# Patient Record
Sex: Female | Born: 1970
Health system: Southern US, Community
[De-identification: ages and names within clinical notes are randomized; demographics above are authoritative.]

## PROBLEM LIST (undated history)

## (undated) DIAGNOSIS — G43909 Migraine, unspecified, not intractable, without status migrainosus: Secondary | ICD-10-CM

## (undated) DIAGNOSIS — R011 Cardiac murmur, unspecified: Secondary | ICD-10-CM

## (undated) DIAGNOSIS — T4145XA Adverse effect of unspecified anesthetic, initial encounter: Secondary | ICD-10-CM

## (undated) DIAGNOSIS — F419 Anxiety disorder, unspecified: Secondary | ICD-10-CM

## (undated) DIAGNOSIS — D352 Benign neoplasm of pituitary gland: Secondary | ICD-10-CM

## (undated) DIAGNOSIS — Z87898 Personal history of other specified conditions: Secondary | ICD-10-CM

## (undated) DIAGNOSIS — E348 Other specified endocrine disorders: Secondary | ICD-10-CM

## (undated) DIAGNOSIS — T8859XA Other complications of anesthesia, initial encounter: Secondary | ICD-10-CM

## (undated) DIAGNOSIS — K760 Fatty (change of) liver, not elsewhere classified: Secondary | ICD-10-CM

## (undated) DIAGNOSIS — R7303 Prediabetes: Secondary | ICD-10-CM

## (undated) DIAGNOSIS — N809 Endometriosis, unspecified: Secondary | ICD-10-CM

## (undated) DIAGNOSIS — K219 Gastro-esophageal reflux disease without esophagitis: Secondary | ICD-10-CM

## (undated) DIAGNOSIS — J45909 Unspecified asthma, uncomplicated: Secondary | ICD-10-CM

## (undated) DIAGNOSIS — R55 Syncope and collapse: Secondary | ICD-10-CM

## (undated) DIAGNOSIS — R748 Abnormal levels of other serum enzymes: Secondary | ICD-10-CM

## (undated) DIAGNOSIS — G2581 Restless legs syndrome: Secondary | ICD-10-CM

## (undated) DIAGNOSIS — M797 Fibromyalgia: Secondary | ICD-10-CM

## (undated) DIAGNOSIS — E785 Hyperlipidemia, unspecified: Secondary | ICD-10-CM

## (undated) DIAGNOSIS — T7491XA Unspecified adult maltreatment, confirmed, initial encounter: Secondary | ICD-10-CM

## (undated) HISTORY — DX: Syncope and collapse: R55

## (undated) HISTORY — DX: Hyperlipidemia, unspecified: E78.5

## (undated) HISTORY — PX: UPPER GI ENDOSCOPY: SHX6162

## (undated) HISTORY — PX: DIAGNOSTIC LAPAROSCOPY: SUR761

## (undated) HISTORY — PX: APPENDECTOMY: SHX54

## (undated) HISTORY — PX: ULNAR NERVE REPAIR: SHX2594

## (undated) HISTORY — DX: Fibromyalgia: M79.7

## (undated) HISTORY — PX: FUNCTIONAL ENDOSCOPIC SINUS SURGERY: SUR616

## (undated) HISTORY — PX: FLEXIBLE SIGMOIDOSCOPY: SHX1649

## (undated) HISTORY — DX: Abnormal levels of other serum enzymes: R74.8

---

## 1998-10-06 HISTORY — PX: VAGINAL HYSTERECTOMY: SUR661

## 2014-12-11 ENCOUNTER — Emergency Department (HOSPITAL_COMMUNITY)
Admission: EM | Admit: 2014-12-11 | Discharge: 2014-12-11 | Disposition: A | Discharge status: Home / Self Care | Payer: 59 | Attending: Emergency Medicine | Admitting: Emergency Medicine

## 2014-12-11 ENCOUNTER — Emergency Department (HOSPITAL_COMMUNITY): Payer: 59

## 2014-12-11 ENCOUNTER — Encounter (HOSPITAL_COMMUNITY): Payer: Self-pay | Admitting: Emergency Medicine

## 2014-12-11 DIAGNOSIS — J0100 Acute maxillary sinusitis, unspecified: Secondary | ICD-10-CM | POA: Diagnosis not present

## 2014-12-11 DIAGNOSIS — H538 Other visual disturbances: Secondary | ICD-10-CM | POA: Diagnosis not present

## 2014-12-11 DIAGNOSIS — M542 Cervicalgia: Secondary | ICD-10-CM | POA: Insufficient documentation

## 2014-12-11 DIAGNOSIS — Z79899 Other long term (current) drug therapy: Secondary | ICD-10-CM | POA: Diagnosis not present

## 2014-12-11 DIAGNOSIS — Z720 Tobacco use: Secondary | ICD-10-CM | POA: Insufficient documentation

## 2014-12-11 DIAGNOSIS — Z792 Long term (current) use of antibiotics: Secondary | ICD-10-CM | POA: Insufficient documentation

## 2014-12-11 DIAGNOSIS — R42 Dizziness and giddiness: Secondary | ICD-10-CM | POA: Insufficient documentation

## 2014-12-11 LAB — URINALYSIS, ROUTINE W REFLEX MICROSCOPIC
BILIRUBIN URINE: NEGATIVE
GLUCOSE, UA: NEGATIVE mg/dL
Hgb urine dipstick: NEGATIVE
Ketones, ur: NEGATIVE mg/dL
Leukocytes, UA: NEGATIVE
NITRITE: NEGATIVE
PH: 6 (ref 5.0–8.0)
Protein, ur: NEGATIVE mg/dL
Urobilinogen, UA: 0.2 mg/dL (ref 0.0–1.0)

## 2014-12-11 LAB — CBC
HCT: 41.6 % (ref 36.0–46.0)
Hemoglobin: 13.8 g/dL (ref 12.0–15.0)
MCH: 31.2 pg (ref 26.0–34.0)
MCHC: 33.2 g/dL (ref 30.0–36.0)
MCV: 94.1 fL (ref 78.0–100.0)
Platelets: 349 10*3/uL (ref 150–400)
RBC: 4.42 MIL/uL (ref 3.87–5.11)
RDW: 13.6 % (ref 11.5–15.5)
WBC: 9 10*3/uL (ref 4.0–10.5)

## 2014-12-11 LAB — BASIC METABOLIC PANEL
ANION GAP: 6 (ref 5–15)
BUN: 13 mg/dL (ref 6–23)
CALCIUM: 9.5 mg/dL (ref 8.4–10.5)
CO2: 31 mmol/L (ref 19–32)
Chloride: 102 mmol/L (ref 96–112)
Creatinine, Ser: 0.76 mg/dL (ref 0.50–1.10)
Glucose, Bld: 93 mg/dL (ref 70–99)
Potassium: 4.1 mmol/L (ref 3.5–5.1)
SODIUM: 139 mmol/L (ref 135–145)

## 2014-12-11 LAB — HEPATIC FUNCTION PANEL
ALT: 21 U/L (ref 0–35)
AST: 20 U/L (ref 0–37)
Albumin: 4.2 g/dL (ref 3.5–5.2)
Alkaline Phosphatase: 64 U/L (ref 39–117)
BILIRUBIN INDIRECT: 0.3 mg/dL (ref 0.3–0.9)
Bilirubin, Direct: 0.1 mg/dL (ref 0.0–0.5)
Total Bilirubin: 0.4 mg/dL (ref 0.3–1.2)
Total Protein: 7.4 g/dL (ref 6.0–8.3)

## 2014-12-11 LAB — TSH: TSH: 2.239 u[IU]/mL (ref 0.350–4.500)

## 2014-12-11 MED ORDER — PROMETHAZINE HCL 25 MG PO TABS: 25.0000 mg | ORAL_TABLET | Freq: Four times a day (QID) | ORAL | Status: DC | PRN

## 2014-12-11 MED ORDER — DOXYCYCLINE HYCLATE 100 MG PO CAPS: 100.0000 mg | ORAL_CAPSULE | Freq: Two times a day (BID) | ORAL | Status: DC

## 2014-12-11 MED ORDER — PROMETHAZINE HCL 25 MG/ML IJ SOLN
12.5000 mg | Freq: Once | INTRAMUSCULAR | Status: AC
  Administered 2014-12-11: 12.5 mg via INTRAVENOUS
  Filled 2014-12-11: qty 1

## 2014-12-11 MED ORDER — SODIUM CHLORIDE 0.9 % IV BOLUS (SEPSIS)
500.0000 mL | Freq: Once | INTRAVENOUS | Status: AC
  Administered 2014-12-11: 500 mL via INTRAVENOUS

## 2014-12-11 MED ORDER — KETOROLAC TROMETHAMINE 30 MG/ML IJ SOLN
30.0000 mg | Freq: Once | INTRAMUSCULAR | Status: AC
  Administered 2014-12-11: 30 mg via INTRAVENOUS
  Filled 2014-12-11: qty 1

## 2014-12-11 NOTE — ED Notes (Signed)
Pt. Wanting to go home, EDP aware, will talk to pt. And discharge shortly.

## 2014-12-11 NOTE — Discharge Instructions (Signed)
Dizziness °Dizziness is a common problem. It is a feeling of unsteadiness or light-headedness. You may feel like you are about to faint. Dizziness can lead to injury if you stumble or fall. A person of any age group can suffer from dizziness, but dizziness is more common in older adults. °CAUSES  °Dizziness can be caused by many different things, including: °· Middle ear problems. °· Standing for too long. °· Infections. °· An allergic reaction. °· Aging. °· An emotional response to something, such as the sight of blood. °· Side effects of medicines. °· Tiredness. °· Problems with circulation or blood pressure. °· Excessive use of alcohol or medicines, or illegal drug use. °· Breathing too fast (hyperventilation). °· An irregular heart rhythm (arrhythmia). °· A low red blood cell count (anemia). °· Pregnancy. °· Vomiting, diarrhea, fever, or other illnesses that cause body fluid loss (dehydration). °· Diseases or conditions such as Parkinson's disease, high blood pressure (hypertension), diabetes, and thyroid problems. °· Exposure to extreme heat. °DIAGNOSIS  °Your health care provider will ask about your symptoms, perform a physical exam, and perform an electrocardiogram (ECG) to record the electrical activity of your heart. Your health care provider may also perform other heart or blood tests to determine the cause of your dizziness. These may include: °· Transthoracic echocardiogram (TTE). During echocardiography, sound waves are used to evaluate how blood flows through your heart. °· Transesophageal echocardiogram (TEE). °· Cardiac monitoring. This allows your health care provider to monitor your heart rate and rhythm in real time. °· Holter monitor. This is a portable device that records your heartbeat and can help diagnose heart arrhythmias. It allows your health care provider to track your heart activity for several days if needed. °· Stress tests by exercise or by giving medicine that makes the heart beat  faster. °TREATMENT  °Treatment of dizziness depends on the cause of your symptoms and can vary greatly. °HOME CARE INSTRUCTIONS  °· Drink enough fluids to keep your urine clear or pale yellow. This is especially important in very hot weather. In older adults, it is also important in cold weather. °· Take your medicine exactly as directed if your dizziness is caused by medicines. When taking blood pressure medicines, it is especially important to get up slowly. °· Rise slowly from chairs and steady yourself until you feel okay. °· In the morning, first sit up on the side of the bed. When you feel okay, stand slowly while holding onto something until you know your balance is fine. °· Move your legs often if you need to stand in one place for a long time. Tighten and relax your muscles in your legs while standing. °· Have someone stay with you for 1-2 days if dizziness continues to be a problem. Do this until you feel you are well enough to stay alone. Have the person call your health care provider if he or she notices changes in you that are concerning. °· Do not drive or use heavy machinery if you feel dizzy. °· Do not drink alcohol. °SEEK IMMEDIATE MEDICAL CARE IF:  °· Your dizziness or light-headedness gets worse. °· You feel nauseous or vomit. °· You have problems talking, walking, or using your arms, hands, or legs. °· You feel weak. °· You are not thinking clearly or you have trouble forming sentences. It may take a friend or family member to notice this. °· You have chest pain, abdominal pain, shortness of breath, or sweating. °· Your vision changes. °· You notice   any bleeding.  You have side effects from medicine that seems to be getting worse rather than better. MAKE SURE YOU:   Understand these instructions.  Will watch your condition.  Will get help right away if you are not doing well or get worse. Document Released: 03/18/2001 Document Revised: 09/27/2013 Document Reviewed: 04/11/2011 [facility]  Patient Information 2015 [place], [place]. This information is not intended to replace advice given to you by your health care provider. Make sure you discuss any questions you have with your health care provider.  Sinusitis Sinusitis is redness, soreness, and inflammation of the paranasal sinuses. Paranasal sinuses are air pockets within the bones of your face (beneath the eyes, the middle of the forehead, or above the eyes). In healthy paranasal sinuses, mucus is able to drain out, and air is able to circulate through them by way of your nose. However, when your paranasal sinuses are inflamed, mucus and air can become trapped. This can allow bacteria and other germs to grow and cause infection. Sinusitis can develop quickly and last only a short time (acute) or continue over a long period (chronic). Sinusitis that lasts for more than 12 weeks is considered chronic.  CAUSES  Causes of sinusitis include:  Allergies.  Structural abnormalities, such as displacement of the cartilage that separates your nostrils (deviated septum), which can decrease the air flow through your nose and sinuses and affect sinus drainage.  Functional abnormalities, such as when the small hairs (cilia) that line your sinuses and help remove mucus do not work properly or are not present. SIGNS AND SYMPTOMS  Symptoms of acute and chronic sinusitis are the same. The primary symptoms are pain and pressure around the affected sinuses. Other symptoms include:  Upper toothache.  Earache.  Headache.  Bad breath.  Decreased sense of smell and taste.  A cough, which worsens when you are lying flat.  Fatigue.  Fever.  Thick drainage from your nose, which often is green and may contain pus (purulent).  Swelling and warmth over the affected sinuses. DIAGNOSIS  Your health care provider will perform a physical exam. During the exam, your health care provider may:  Look in your nose for signs of abnormal growths in your  nostrils (nasal polyps).  Tap over the affected sinus to check for signs of infection.  View the inside of your sinuses (endoscopy) using an imaging device that has a light attached (endoscope). If your health care provider suspects that you have chronic sinusitis, one or more of the following tests may be recommended:  Allergy tests.  Nasal culture. A sample of mucus is taken from your nose, sent to a lab, and screened for bacteria.  Nasal cytology. A sample of mucus is taken from your nose and examined by your health care provider to determine if your sinusitis is related to an allergy. TREATMENT  Most cases of acute sinusitis are related to a viral infection and will resolve on their own within 10 days. Sometimes medicines are prescribed to help relieve symptoms (pain medicine, decongestants, nasal steroid sprays, or saline sprays).  However, for sinusitis related to a bacterial infection, your health care provider will prescribe antibiotic medicines. These are medicines that will help kill the bacteria causing the infection.  Rarely, sinusitis is caused by a fungal infection. In theses cases, your health care provider will prescribe antifungal medicine. For some cases of chronic sinusitis, surgery is needed. Generally, these are cases in which sinusitis recurs more than 3 times per year, despite other  treatments. HOME CARE INSTRUCTIONS   Drink plenty of water. Water helps thin the mucus so your sinuses can drain more easily.  Use a humidifier.  Inhale steam 3 to 4 times a day (for example, sit in the bathroom with the shower running).  Apply a warm, moist washcloth to your face 3 to 4 times a day, or as directed by your health care provider.  Use saline nasal sprays to help moisten and clean your sinuses.  Take medicines only as directed by your health care provider.  If you were prescribed either an antibiotic or antifungal medicine, finish it all even if you start to feel  better. SEEK IMMEDIATE MEDICAL CARE IF:  You have increasing pain or severe headaches.  You have nausea, vomiting, or drowsiness.  You have swelling around your face.  You have vision problems.  You have a stiff neck.  You have difficulty breathing. MAKE SURE YOU:   Understand these instructions.  Will watch your condition.  Will get help right away if you are not doing well or get worse. Document Released: 09/22/2005 Document Revised: 02/06/2014 Document Reviewed: 10/07/2011 [facility] Patient Information 2015 [place], [place]. This information is not intended to replace advice given to you by your health care provider. Make sure you discuss any questions you have with your health care provider.

## 2014-12-11 NOTE — ED Notes (Addendum)
Pt reports dizziness,blurred vision,generalized weakness, fatigue since last Tuesday.pt alert and oriented. Speech clear. Airway patent.pt reports last known fever on Friday.pt denies any recent fall.

## 2014-12-11 NOTE — ED Provider Notes (Signed)
CSN: [id number]     Arrival date & time 12/11/14  0258 History  This chart was scribed for [employer]. Alvino Chapel, MD by Chester Holstein, ED Scribe. This patient was seen in room APA14/APA14 and the patient's care was started at 8:47 AM.    Chief Complaint  Patient presents with  . Dizziness    Patient is a 44 y.o. female presenting with dizziness. The history is provided by the patient. No language interpreter was used.  Dizziness Associated symptoms: headaches and weakness   Associated symptoms: no nausea, no shortness of breath and no vomiting    HPI Comments: Erica Simmons is a 44 y.o. female who presents to the Emergency Department complaining of dizziness and lightheadedness with onset 4 days ago. Pt notes assocaited headaches, blurred vision, neck pain generalized body aches, weakness, fatigue, and confusion. Pt has gained 40 lbs in 7 months despite increase in activity. Pt notes fluctuating changes in temperature between 96-101, last fever on Friday. Pt denies recent sick contacts. Pt utd with flu shot. Pt A&Ox 4. Pt with h/o of abdominal hysterectomy with bilateral salpingo oophorectomy. Pt denies nausea, vomiting, difficulty urinating, dysuria, cough, LOC, and SOB. Pt has no PCP.   History reviewed. No pertinent past medical history. Past Surgical History  Procedure Laterality Date  . Abdominal hysterectomy     History reviewed. No pertinent family history. History  Substance Use Topics  . Smoking status: Current Some Day Smoker -- 0.05 packs/day  . Smokeless tobacco: Not on file  . Alcohol Use: No   OB History    No data available     Review of Systems  Constitutional: Positive for fever, fatigue and unexpected weight change.  Eyes: Positive for visual disturbance.  Respiratory: Negative for cough and shortness of breath.   Gastrointestinal: Negative for nausea and vomiting.  Genitourinary: Negative for dysuria and difficulty urinating.  Musculoskeletal: Positive for  myalgias and neck pain.  Neurological: Positive for dizziness, weakness, light-headedness and headaches. Negative for syncope.      Allergies  Review of patient's allergies indicates no known allergies.  Home Medications   Prior to Admission medications   Medication Sig Start Date End Date Taking? Authorizing Provider  ALPRAZolam Duanne Moron) 0.5 MG tablet Take 0.5 mg by mouth at bedtime as needed for sleep.   Yes Historical Provider, MD  Aspirin-Acetaminophen-Caffeine (GOODY HEADACHE PO) Take 1 packet by mouth daily as needed (pain).   Yes Historical Provider, MD  ibuprofen (ADVIL,MOTRIN) 200 MG tablet Take 800 mg by mouth every 6 (six) hours as needed for moderate pain.   Yes Historical Provider, MD  doxycycline (VIBRAMYCIN) 100 MG capsule Take 1 capsule (100 mg total) by mouth 2 (two) times daily. 12/11/14   Davonna Belling, MD  promethazine (PHENERGAN) 25 MG tablet Take 1 tablet (25 mg total) by mouth every 6 (six) hours as needed for nausea. 12/11/14   Davonna Belling, MD   BP 116/81 mmHg  Pulse 84  Temp(Src) 98.3 F (36.8 C) (Oral)  Resp 19  Ht 5\' 3"  (1.6 m)  Wt 160 lb (72.576 kg)  BMI 28.35 kg/m2  SpO2 100% Physical Exam  Constitutional: She is oriented to person, place, and time. She appears well-developed and well-nourished.  HENT:  Head: Normocephalic.  Eyes: Conjunctivae and EOM are normal. Pupils are equal, round, and reactive to light.  Neck: Normal range of motion. Neck supple.  Cardiovascular: Normal rate, regular rhythm and normal heart sounds.   Pulmonary/Chest: Effort normal and breath sounds normal.  Abdominal: Soft. There is no tenderness.  Musculoskeletal: Normal range of motion.  Neurological: She is alert and oriented to person, place, and time.  Finger nose slightly off on right side, left side normal Good grip strength bilaterally  Heel to shin normal bilaterally  Skin: Skin is warm and dry.  Psychiatric: She has a normal mood and affect. Her behavior is  normal.  Nursing note and vitals reviewed.   ED Course  Procedures (including critical care time) DIAGNOSTIC STUDIES: Oxygen Saturation is 100% on room air, normal by my interpretation.    COORDINATION OF CARE: 8:53 AM Discussed treatment plan with patient at beside, the patient agrees with the plan and has no further questions at this time.   Labs Review Labs Reviewed  URINALYSIS, ROUTINE W REFLEX MICROSCOPIC - Abnormal; Notable for the following:    Specific Gravity, Urine <1.005 (*)    All other components within normal limits  CBC  BASIC METABOLIC PANEL  HEPATIC FUNCTION PANEL  TSH    Imaging Review Dg Chest 2 View  12/11/2014   CLINICAL DATA:  Back pain, dizziness, neck pain  EXAM: CHEST  2 VIEW  COMPARISON:  None.  FINDINGS: Cardiomediastinal silhouette is unremarkable. No acute infiltrate or pleural effusion. No pulmonary edema. Mild degenerative changes thoracic spine.  IMPRESSION: No active cardiopulmonary disease.   Electronically Signed   By: Lahoma Crocker M.D.   On: 12/11/2014 09:27   Ct Head Wo Contrast  12/11/2014   CLINICAL DATA:  Headache for 6 days  EXAM: CT HEAD WITHOUT CONTRAST  TECHNIQUE: Contiguous axial images were obtained from the base of the skull through the vertex without intravenous contrast.  COMPARISON:  None.  FINDINGS: No skull fracture is noted. There is mucosal thickening with partial opacification right maxillary sinus. Mucosal thickening with partial opacification bilateral ethmoid air cells right greater than left. The mastoid air cells are unremarkable.  No intracranial hemorrhage, mass effect or midline shift. No acute infarction. No mass lesion is noted on this unenhanced scan.  IMPRESSION: No acute intracranial abnormality. Paranasal sinuses disease as described above.   Electronically Signed   By: Lahoma Crocker M.D.   On: 12/11/2014 09:31     EKG Interpretation None      MDM   Final diagnoses:  Dizziness  Acute maxillary sinusitis, recurrence  not specified   Patient with dizziness and headache. Also has had weight gain since moving here a few months ago. Head CT reassuring except for some sinusitis. Will treat that. Lab work reassuring. Feels somewhat better after treatment with Phenergan and Toradol. Will discharge home will need to follow-up with her primary care doctor. Doubt meningitis. Lab work reassuring. Will discharge home I personally performed the services described in this documentation, which was scribed in my presence. The recorded information has been reviewed and is accurate.      Davonna Belling, MD 12/11/14 [id number]

## 2014-12-11 NOTE — ED Notes (Signed)
MD at bedside. 

## 2015-11-06 ENCOUNTER — Emergency Department (HOSPITAL_COMMUNITY): Payer: 59

## 2015-11-06 ENCOUNTER — Emergency Department (HOSPITAL_COMMUNITY)
Admission: EM | Admit: 2015-11-06 | Discharge: 2015-11-06 | Disposition: A | Discharge status: Home / Self Care | Payer: 59 | Attending: Emergency Medicine | Admitting: Emergency Medicine

## 2015-11-06 ENCOUNTER — Encounter (HOSPITAL_COMMUNITY): Payer: Self-pay | Admitting: *Deleted

## 2015-11-06 DIAGNOSIS — R52 Pain, unspecified: Secondary | ICD-10-CM

## 2015-11-06 DIAGNOSIS — R5381 Other malaise: Secondary | ICD-10-CM

## 2015-11-06 DIAGNOSIS — Z792 Long term (current) use of antibiotics: Secondary | ICD-10-CM | POA: Insufficient documentation

## 2015-11-06 DIAGNOSIS — F17200 Nicotine dependence, unspecified, uncomplicated: Secondary | ICD-10-CM | POA: Diagnosis not present

## 2015-11-06 LAB — URINALYSIS, ROUTINE W REFLEX MICROSCOPIC
Bilirubin Urine: NEGATIVE
GLUCOSE, UA: NEGATIVE mg/dL
HGB URINE DIPSTICK: NEGATIVE
Ketones, ur: NEGATIVE mg/dL
LEUKOCYTES UA: NEGATIVE
Nitrite: NEGATIVE
PH: 6.5 (ref 5.0–8.0)
Protein, ur: NEGATIVE mg/dL
SPECIFIC GRAVITY, URINE: 1.013 (ref 1.005–1.030)

## 2015-11-06 LAB — COMPREHENSIVE METABOLIC PANEL
ALT: 56 U/L — AB (ref 14–54)
AST: 55 U/L — ABNORMAL HIGH (ref 15–41)
Albumin: 3.6 g/dL (ref 3.5–5.0)
Alkaline Phosphatase: 66 U/L (ref 38–126)
Anion gap: 9 (ref 5–15)
BILIRUBIN TOTAL: 0.5 mg/dL (ref 0.3–1.2)
BUN: 12 mg/dL (ref 6–20)
CHLORIDE: 102 mmol/L (ref 101–111)
CO2: 29 mmol/L (ref 22–32)
Calcium: 9.8 mg/dL (ref 8.9–10.3)
Creatinine, Ser: 0.81 mg/dL (ref 0.44–1.00)
GFR calc Af Amer: >60 mL/min (ref >60)
Glucose, Bld: 102 mg/dL — ABNORMAL HIGH (ref 65–99)
Potassium: 4.1 mmol/L (ref 3.5–5.1)
Sodium: 140 mmol/L (ref 135–145)
TOTAL PROTEIN: 6.5 g/dL (ref 6.5–8.1)

## 2015-11-06 LAB — CBC WITH DIFFERENTIAL/PLATELET
BASOS ABS: 0 10*3/uL (ref 0.0–0.1)
Basophils Relative: 0 %
EOS PCT: 5 %
Eosinophils Absolute: 0.4 10*3/uL (ref 0.0–0.7)
HEMATOCRIT: 41.3 % (ref 36.0–46.0)
HEMOGLOBIN: 13.6 g/dL (ref 12.0–15.0)
LYMPHS ABS: 3.2 10*3/uL (ref 0.7–4.0)
LYMPHS PCT: 42 %
MCH: 30.4 pg (ref 26.0–34.0)
MCHC: 32.9 g/dL (ref 30.0–36.0)
MCV: 92.2 fL (ref 78.0–100.0)
Monocytes Absolute: 0.3 10*3/uL (ref 0.1–1.0)
Monocytes Relative: 4 %
NEUTROS ABS: 3.7 10*3/uL (ref 1.7–7.7)
NEUTROS PCT: 49 %
Platelets: 335 10*3/uL (ref 150–400)
RBC: 4.48 MIL/uL (ref 3.87–5.11)
RDW: 13.5 % (ref 11.5–15.5)
WBC: 7.5 10*3/uL (ref 4.0–10.5)

## 2015-11-06 LAB — TSH: TSH: 2.081 u[IU]/mL (ref 0.350–4.500)

## 2015-11-06 MED ORDER — HYDROCODONE-ACETAMINOPHEN 5-325 MG PO TABS: 1.0000 | ORAL_TABLET | Freq: Four times a day (QID) | ORAL | Status: DC | PRN

## 2015-11-06 NOTE — ED Provider Notes (Signed)
CSN: [id number]     Arrival date & time 11/06/15  [id number] History   First MD Initiated Contact with Patient 11/06/15 [phone]     Chief Complaint  Patient presents with  . Generalized Body Aches     The history is provided by the patient. No language interpreter was used.   Erica Simmons is a 45 y.o. female who presents to the Emergency Department complaining of multiple complaints.  She reports 9 days ago developing flu like symptoms with body aches followed by ear, jaw, and throat pain.  She started amoxicillin five days ago following a dental extraction.  She also endorses generalized weakness, nausea. Symptoms are moderate, constant and worsening. She is concerned with these pains that she may have bone cancer. She has pain in all of her bones.  History reviewed. No pertinent past medical history. Past Surgical History  Procedure Laterality Date  . Abdominal hysterectomy     No family history on file. Social History  Substance Use Topics  . Smoking status: Current Some Day Smoker -- 0.50 packs/day  . Smokeless tobacco: None  . Alcohol Use: No   OB History    No data available     Review of Systems  All other systems reviewed and are negative.     Allergies  Review of patient's allergies indicates no known allergies.  Home Medications   Prior to Admission medications   Medication Sig Start Date End Date Taking? Authorizing Provider  amoxicillin (AMOXIL) 500 MG capsule Take 500 mg by mouth 3 (three) times daily.   Yes Historical Provider, MD  Aspirin-Acetaminophen-Caffeine (GOODY HEADACHE PO) Take 1 packet by mouth daily as needed (pain).   Yes Historical Provider, MD  ibuprofen (ADVIL,MOTRIN) 100 MG/5ML suspension Take 200 mg by mouth every 4 (four) hours as needed for mild pain.   Yes Historical Provider, MD  ibuprofen (ADVIL,MOTRIN) 200 MG tablet Take 800 mg by mouth every 6 (six) hours as needed for moderate pain.   Yes Historical Provider, MD  HYDROcodone-acetaminophen  (NORCO/VICODIN) 5-325 MG tablet Take 1 tablet by mouth every 6 (six) hours as needed. 11/06/15   Quintella Reichert, MD   BP 116/76 mmHg  Pulse 80  Temp(Src) 97.9 F (36.6 C) (Oral)  Resp 16  Ht 5\' 3"  (1.6 m)  Wt 155 lb (70.308 kg)  BMI 27.46 kg/m2  SpO2 100% Physical Exam  Constitutional: She is oriented to person, place, and time. She appears well-developed and well-nourished.  HENT:  Head: Normocephalic and atraumatic.  Cardiovascular: Normal rate and regular rhythm.   No murmur heard. Pulmonary/Chest: Effort normal and breath sounds normal. No respiratory distress.  Abdominal: Soft. There is no tenderness. There is no rebound and no guarding.  Musculoskeletal: She exhibits no edema or tenderness.  Neurological: She is alert and oriented to person, place, and time.  Skin: Skin is warm and dry.  Psychiatric: She has a normal mood and affect. Her behavior is normal.  Nursing note and vitals reviewed.   ED Course  Procedures (including critical care time) Labs Review Labs Reviewed  COMPREHENSIVE METABOLIC PANEL - Abnormal; Notable for the following:    Glucose, Bld 102 (*)    AST 55 (*)    ALT 56 (*)    All other components within normal limits  CBC WITH DIFFERENTIAL/PLATELET  URINALYSIS, ROUTINE W REFLEX MICROSCOPIC (NOT AT [facility])  TSH    Imaging Review Dg Chest 2 View  11/06/2015  CLINICAL DATA:  Body aches and fever for 9  days, smoker EXAM: CHEST  2 VIEW COMPARISON:  12/11/2014 FINDINGS: Normal heart size, mediastinal contours, and pulmonary vascularity. Minimal linear subsegmental atelectasis at RIGHT base. Lungs otherwise clear. No pulmonary infiltrate, pleural effusion or pneumothorax. Bones unremarkable. IMPRESSION: Linear subsegmental atelectasis at base of RIGHT middle lobe. Electronically Signed   By: Lavonia Dana M.D.   On: 11/06/2015 09:32   I have personally reviewed and evaluated these images and lab results as part of my medical decision-making.   EKG  Interpretation None      MDM   Final diagnoses:  Malaise  Body aches    Patient here for evaluation of generalized body aches and malaise. Symptoms have been present for the last several months but worsening for the last 9 days. She does endorse night sweats. She is nontoxic appearing on examination. Chest x-ray is not consistent with pneumonia. UA is negative with UTI. History of presentation is not consistent with rhabdomyolysis. There are no significant electrolyte abnormalities. Patient has minimal elevation in her transaminases of unclear significance. Discussed with patient that she will have to follow up with her PCP for further testing. Also discussed home care and return precautions.    Quintella Reichert, MD 11/06/15 [phone]

## 2015-11-06 NOTE — ED Notes (Signed)
Pt here with multiple complaints, pt has written over a one page description of S/S for the last 9 months with complaints of crying out in pain while asleep to toothache.  Pt is on Amoxicillin currently after having tooth pulled.  Pt c/o pain all over her body in her "bones".  Advises she has had "fevers" but not higher than 100.

## 2015-11-06 NOTE — ED Notes (Signed)
Pt verbalized understanding of d/c instructions, prescriptions, and follow-up care. No further questions/concerns, VSS, ambulatory w/ steady gait (refused wheelchair) 

## 2015-11-06 NOTE — Discharge Instructions (Signed)
The cause of your symptoms was not identified today. Your liver enzymes were just a little bit elevated and this will need to be rechecked by your family doctor. Avoid alcohol. Do not take high doses of Tylenol. You can take ibuprofen or Aleve over-the-counter as needed for pain.   Pain Without a Known Cause WHAT IS PAIN WITHOUT A KNOWN CAUSE? Pain can occur in any part of the body and can range from mild to severe. Sometimes no cause can be found for why you are having pain. Some types of pain that can occur without a known cause include:   Headache.  Back pain.  Abdominal pain.  Neck pain. HOW IS PAIN WITHOUT A KNOWN CAUSE DIAGNOSED?  Your health care provider will try to find the cause of your pain. This may include:  Physical exam.  Medical history.  Blood tests.  Urine tests.  X-rays. If no cause is found, your health care provider may diagnose you with pain without a known cause.  IS THERE TREATMENT FOR PAIN WITHOUT A CAUSE?  Treatment depends on the kind of pain you have. Your health care provider may prescribe medicines to help relieve your pain.  WHAT CAN I DO AT HOME FOR MY PAIN?   Take medicines only as directed by your health care provider.  Stop any activities that cause pain. During periods of severe pain, bed rest may help.  Try to reduce your stress with activities such as yoga or meditation. Talk to your health care provider for other stress-reducing activity recommendations.  Exercise regularly, if approved by your health care provider.  Eat a healthy diet that includes fruits and vegetables. This may improve pain. Talk to your health care provider if you have any questions about your diet. WHAT IF MY PAIN DOES NOT GET BETTER?  If you have a painful condition and no reason can be found for the pain or the pain gets worse, it is important to follow up with your health care provider. It may be necessary to repeat tests and look further for a possible cause.    This information is not intended to replace advice given to you by your health care provider. Make sure you discuss any questions you have with your health care provider.   Document Released: 06/17/2001 Document Revised: 10/13/2014 Document Reviewed: 02/07/2014 Elsevier Interactive Patient Education 2016 [employer]  Weakness Weakness is a lack of strength. It may be felt all over the body (generalized) or in one specific part of the body (focal). Some causes of weakness can be serious. You may need further medical evaluation, especially if you are elderly or you have a history of immunosuppression (such as chemotherapy or HIV), kidney disease, heart disease, or diabetes. CAUSES  Weakness can be caused by many different things, including:  Infection.  Physical exhaustion.  Internal bleeding or other blood loss that results in a lack of red blood cells (anemia).  Dehydration. This cause is more common in elderly people.  Side effects or electrolyte abnormalities from medicines, such as pain medicines or sedatives.  Emotional distress, anxiety, or depression.  Circulation problems, especially severe peripheral arterial disease.  Heart disease, such as rapid atrial fibrillation, bradycardia, or heart failure.  Nervous system disorders, such as Guillain-Barr syndrome, multiple sclerosis, or stroke. DIAGNOSIS  To find the cause of your weakness, your caregiver will take your history and perform a physical exam. Lab tests or X-rays may also be ordered, if needed. TREATMENT  Treatment of weakness depends  on the cause of your symptoms and can vary greatly. HOME CARE INSTRUCTIONS   Rest as needed.  Eat a well-balanced diet.  Try to get some exercise every day.  Only take over-the-counter or prescription medicines as directed by your caregiver. SEEK MEDICAL CARE IF:   Your weakness seems to be getting worse or spreads to other parts of your body.  You develop new aches or  pains. SEEK IMMEDIATE MEDICAL CARE IF:   You cannot perform your normal daily activities, such as getting dressed and feeding yourself.  You cannot walk up and down stairs, or you feel exhausted when you do so.  You have shortness of breath or chest pain.  You have difficulty moving parts of your body.  You have weakness in only one area of the body or on only one side of the body.  You have a fever.  You have trouble speaking or swallowing.  You cannot control your bladder or bowel movements.  You have black or bloody vomit or stools. MAKE SURE YOU:  Understand these instructions.  Will watch your condition.  Will get help right away if you are not doing well or get worse.   This information is not intended to replace advice given to you by your health care provider. Make sure you discuss any questions you have with your health care provider.   Document Released: 09/22/2005 Document Revised: 03/23/2012 Document Reviewed: 11/21/2011 Elsevier Interactive Patient Education [employer].

## 2015-11-10 ENCOUNTER — Encounter (HOSPITAL_COMMUNITY): Payer: Self-pay | Admitting: Emergency Medicine

## 2015-11-10 ENCOUNTER — Emergency Department (HOSPITAL_COMMUNITY): Payer: 59

## 2015-11-10 ENCOUNTER — Observation Stay (HOSPITAL_COMMUNITY)
Admission: EM | Admit: 2015-11-10 | Discharge: 2015-11-11 | Disposition: A | Discharge status: Home / Self Care | Payer: 59 | Attending: Internal Medicine | Admitting: Internal Medicine

## 2015-11-10 DIAGNOSIS — R55 Syncope and collapse: Principal | ICD-10-CM | POA: Diagnosis present

## 2015-11-10 DIAGNOSIS — M791 Myalgia: Secondary | ICD-10-CM | POA: Diagnosis not present

## 2015-11-10 DIAGNOSIS — M2550 Pain in unspecified joint: Secondary | ICD-10-CM | POA: Diagnosis not present

## 2015-11-10 DIAGNOSIS — F17210 Nicotine dependence, cigarettes, uncomplicated: Secondary | ICD-10-CM | POA: Diagnosis not present

## 2015-11-10 DIAGNOSIS — R5383 Other fatigue: Secondary | ICD-10-CM

## 2015-11-10 DIAGNOSIS — J320 Chronic maxillary sinusitis: Secondary | ICD-10-CM | POA: Diagnosis not present

## 2015-11-10 DIAGNOSIS — K047 Periapical abscess without sinus: Secondary | ICD-10-CM | POA: Diagnosis not present

## 2015-11-10 DIAGNOSIS — J322 Chronic ethmoidal sinusitis: Secondary | ICD-10-CM | POA: Insufficient documentation

## 2015-11-10 DIAGNOSIS — R748 Abnormal levels of other serum enzymes: Secondary | ICD-10-CM | POA: Diagnosis present

## 2015-11-10 DIAGNOSIS — Z90710 Acquired absence of both cervix and uterus: Secondary | ICD-10-CM | POA: Diagnosis not present

## 2015-11-10 DIAGNOSIS — W19XXXA Unspecified fall, initial encounter: Secondary | ICD-10-CM | POA: Insufficient documentation

## 2015-11-10 DIAGNOSIS — Z7982 Long term (current) use of aspirin: Secondary | ICD-10-CM | POA: Insufficient documentation

## 2015-11-10 DIAGNOSIS — Z720 Tobacco use: Secondary | ICD-10-CM | POA: Diagnosis present

## 2015-11-10 DIAGNOSIS — R945 Abnormal results of liver function studies: Secondary | ICD-10-CM

## 2015-11-10 DIAGNOSIS — M898X9 Other specified disorders of bone, unspecified site: Secondary | ICD-10-CM | POA: Diagnosis present

## 2015-11-10 LAB — CBC
HCT: 40.3 % (ref 36.0–46.0)
Hemoglobin: 13.5 g/dL (ref 12.0–15.0)
MCH: 31 pg (ref 26.0–34.0)
MCHC: 33.5 g/dL (ref 30.0–36.0)
MCV: 92.6 fL (ref 78.0–100.0)
PLATELETS: 331 10*3/uL (ref 150–400)
RBC: 4.35 MIL/uL (ref 3.87–5.11)
RDW: 13.6 % (ref 11.5–15.5)
WBC: 7.9 10*3/uL (ref 4.0–10.5)

## 2015-11-10 LAB — URINALYSIS, ROUTINE W REFLEX MICROSCOPIC
BILIRUBIN URINE: NEGATIVE
Glucose, UA: NEGATIVE mg/dL
HGB URINE DIPSTICK: NEGATIVE
Ketones, ur: NEGATIVE mg/dL
Leukocytes, UA: NEGATIVE
Nitrite: NEGATIVE
PROTEIN: NEGATIVE mg/dL
Specific Gravity, Urine: 1.019 (ref 1.005–1.030)
pH: 7 (ref 5.0–8.0)

## 2015-11-10 LAB — BASIC METABOLIC PANEL
Anion gap: 15 (ref 5–15)
BUN: 14 mg/dL (ref 6–20)
CALCIUM: 9.8 mg/dL (ref 8.9–10.3)
CO2: 26 mmol/L (ref 22–32)
CREATININE: 0.74 mg/dL (ref 0.44–1.00)
Chloride: 101 mmol/L (ref 101–111)
GFR calc non Af Amer: >60 mL/min (ref >60)
GLUCOSE: 117 mg/dL — AB (ref 65–99)
Potassium: 4.8 mmol/L (ref 3.5–5.1)
Sodium: 142 mmol/L (ref 135–145)

## 2015-11-10 LAB — CK: CK TOTAL: 128 U/L (ref 38–234)

## 2015-11-10 LAB — CBG MONITORING, ED: GLUCOSE-CAPILLARY: 113 mg/dL — AB (ref 65–99)

## 2015-11-10 LAB — HEPATIC FUNCTION PANEL
ALBUMIN: 4 g/dL (ref 3.5–5.0)
ALK PHOS: 63 U/L (ref 38–126)
ALT: 96 U/L — AB (ref 14–54)
AST: 51 U/L — ABNORMAL HIGH (ref 15–41)
BILIRUBIN TOTAL: 0.4 mg/dL (ref 0.3–1.2)
Bilirubin, Direct: <0.1 mg/dL — ABNORMAL LOW (ref 0.1–0.5)
Total Protein: 7 g/dL (ref 6.5–8.1)

## 2015-11-10 LAB — SEDIMENTATION RATE: Sed Rate: 22 mm/hr (ref 0–22)

## 2015-11-10 LAB — C-REACTIVE PROTEIN: CRP: 0.8 mg/dL (ref <1.0)

## 2015-11-10 LAB — POC URINE PREG, ED: Preg Test, Ur: NEGATIVE

## 2015-11-10 MED ORDER — AMOXICILLIN 500 MG PO CAPS
500.0000 mg | ORAL_CAPSULE | Freq: Three times a day (TID) | ORAL | Status: DC
  Administered 2015-11-10 (×2): 500 mg via ORAL
  Filled 2015-11-10 (×6): qty 1

## 2015-11-10 MED ORDER — SODIUM CHLORIDE 0.9% FLUSH
3.0000 mL | Freq: Two times a day (BID) | INTRAVENOUS | Status: DC
  Administered 2015-11-10: 3 mL via INTRAVENOUS

## 2015-11-10 MED ORDER — KETOROLAC TROMETHAMINE 30 MG/ML IJ SOLN
30.0000 mg | Freq: Once | INTRAMUSCULAR | Status: AC
  Administered 2015-11-10: 30 mg via INTRAVENOUS
  Filled 2015-11-10: qty 1

## 2015-11-10 MED ORDER — NICOTINE 7 MG/24HR TD PT24
7.0000 mg | MEDICATED_PATCH | Freq: Every day | TRANSDERMAL | Status: DC
  Administered 2015-11-10: 7 mg via TRANSDERMAL
  Filled 2015-11-10 (×3): qty 1

## 2015-11-10 MED ORDER — ENOXAPARIN SODIUM 40 MG/0.4ML ~~LOC~~ SOLN
40.0000 mg | SUBCUTANEOUS | Status: DC
  Administered 2015-11-10: 40 mg via SUBCUTANEOUS
  Filled 2015-11-10: qty 0.4

## 2015-11-10 MED ORDER — SODIUM CHLORIDE 0.9 % IV BOLUS (SEPSIS)
1000.0000 mL | Freq: Once | INTRAVENOUS | Status: AC
  Administered 2015-11-10: 1000 mL via INTRAVENOUS

## 2015-11-10 MED ORDER — TRAMADOL HCL 50 MG PO TABS
50.0000 mg | ORAL_TABLET | Freq: Four times a day (QID) | ORAL | Status: DC | PRN
  Administered 2015-11-10 – 2015-11-11 (×3): 50 mg via ORAL
  Filled 2015-11-10 (×3): qty 1

## 2015-11-10 MED ORDER — ONDANSETRON HCL 4 MG PO TABS
4.0000 mg | ORAL_TABLET | Freq: Three times a day (TID) | ORAL | Status: DC | PRN
  Administered 2015-11-10: 4 mg via ORAL
  Filled 2015-11-10: qty 1

## 2015-11-10 MED ORDER — HYDROXYZINE HCL 25 MG PO TABS
25.0000 mg | ORAL_TABLET | Freq: Once | ORAL | Status: AC
  Administered 2015-11-10: 25 mg via ORAL
  Filled 2015-11-10: qty 1

## 2015-11-10 NOTE — H&P (Signed)
Date: 11/10/2015               Patient Name:  Erica Simmons MRN: [id number]  DOB: 07/04/1971 Age / Sex: 45 y.o., female   PCP: No Pcp Per Patient              Medical Service: Internal Medicine Teaching Service              Attending Physician: Dr. Oval Linsey, MD    First Contact: Ruthann Cancer , MS3  Pager: [phone]  Second Contact: Dr. Osa Craver Pager: [phone]  Third Contact Dr. Burgess Estelle Pager: [phone]       After Hours (After 5p/  First Contact Pager: [phone]  weekends / holidays): Second Contact Pager: [phone]   Chief Complaint:  Syncopal episodes  History of Present Illness: Erica Simmons is a 45 year old female with no significant past medical history that is being admitted for multiple syncopal episodes. In the past 24 hours she has passed out four times, three times yesterday and once this morning. Her first episode occurred while arguing with her husband and she believes that her emotional distress caused her to pass out. In the ten seconds leading up to her "losing consciousness" she reports  feeling warm, nauseated, and having some bone pain present prior to her fall. These symptoms have been present with her other three falls and are present at baseline as well. After one of these episodes of passing out, her husband reports she seemed confused, was mumbling incoherently, and flailing her arms and legs around.  About two weeks ago patient reports having flu like symptoms, sweating, temperature  with, malaise, and diffuse bone pain that waxes and wanes in intensity. The pain is constant and varies from a 5/10 to a 13/10. The bone pain is mildly relived by pain medication but always present at some level and interferes with her ability to sleep. She also had a tooth extraction about 2 weeks ago, but this was after these symptoms had presented. She was given a 10 day course of amoxicillin TID to complete, but has only been taking it twice a day because  her stomach cannot tolerate it. She did endorse ringing in her right ear that occurs intermittently, last a few seconds, and has only noticed after her tooth extraction two weeks ago. Her most recent admission to the emergency room at [facility] was on 11/06/2015 and she presented with a very similar clinical picture with the only difference being these recent syncopal episodes for the past 24 hours.  ROS negative for: palpations, chest pain, recent illnesses, blurry/tunnel vision, or vomiting She did endorse: 40lb weight gain in the past year.   Past medical history: Gluten intolerance per patient Past surgical history includes a total hysterectomy at age 83 due to endometrosis, w/post-op hormonal therapy for a short while before discontinuing. She has also had ulnar nerve release for ulnar nerve entrapment. Family history: No history of seizures, or epilepsy in the family. There is a history of diabetes, osteoarthritis, on the mother side of the family, as well as her grandmother who had two mechanical heart valves placed. Social history: Smokes 0.5ppd for 25 yrs. Uses alcohol socially(maybe 2 social events per month will have a few drinks). No reported illicit/recreational drug use. Lives at home with husband, son in law and 2 daughters, one of which is in college right now. She is not employed and her husband works two jobs. Sick contacts: Has daughter at home  who recently got over a "head cold". Her family recently traveled to [place] and spent time with her grandmother and also walked in the woods for about half a day.    Meds: No current facility-administered medications for this encounter.   Current Outpatient Prescriptions  Medication Sig Dispense Refill  . amoxicillin (AMOXIL) 500 MG capsule Take 500 mg by mouth 3 (three) times daily.    . Aspirin-Acetaminophen-Caffeine (GOODY HEADACHE PO) Take 1 packet by mouth daily as needed (pain).    Erica Kitchen HYDROcodone-acetaminophen (NORCO/VICODIN)  5-325 MG tablet Take 1 tablet by mouth every 6 (six) hours as needed. 10 tablet 0  . ibuprofen (ADVIL,MOTRIN) 100 MG/5ML suspension Take 200 mg by mouth every 4 (four) hours as needed for mild pain.    Erica Kitchen ibuprofen (ADVIL,MOTRIN) 200 MG tablet Take 800 mg by mouth every 6 (six) hours as needed for moderate pain.      Allergies: Allergies as of 11/10/2015  . (No Known Allergies)   History reviewed. No pertinent past medical history. Past Surgical History  Procedure Laterality Date  . Abdominal hysterectomy  2000    Total Hysterectomy for Endometriosis   Family History  Problem Relation Age of Onset  . Heart disease Maternal Grandmother   . Osteoporosis Maternal Grandmother    Social History   Social History  . Marital Status: Married    Spouse Name: N/A  . Number of Children: N/A  . Years of Education: N/A   Occupational History  . Not on file.   Social History Main Topics  . Smoking status: Current Some Day Smoker -- 0.50 packs/day  . Smokeless tobacco: Not on file  . Alcohol Use: No  . Drug Use: No  . Sexual Activity: Not on file   Other Topics Concern  . Not on file   Social History Narrative    Review of Systems: Pertinent items are noted in HPI.  Physical Exam: Blood pressure 118/87, pulse 82, temperature 98.4 F (36.9 C), temperature source Oral, resp. rate 16, height '5\' 3"'  (1.6 m), weight 70.308 kg (155 lb), SpO2 98 %. General appearance: alert, cooperative, mildly obese and distressed at presentation, patient was tearful at introduction but as conversation progressed seemed cheerful and optimistic Head: Normocephalic, without obvious abnormality, atraumatic Eyes: conjunctivae/corneas clear. PERRL, EOM's intact. Fundi benign. Nose: Nares normal. Septum midline. Mucosa normal. No drainage or sinus tenderness., no discharge Throat: mucous membranes were moist, no signs of infection at site where R. wisdom tooth was removed Neck: no adenopathy and supple,  symmetrical, trachea midline Lungs: clear to auscultation bilaterally Heart: regular rate and rhythm, S1, S2 normal, no murmur, click, rub or gallop Abdomen: soft, non-tender; bowel sounds normal; no masses,  no organomegaly Extremities: extremities normal, atraumatic, no cyanosis or edema Pulses: 2+ and symmetric Lymph nodes: Cervical adenopathy: no palpable lymph nodes and Supraclavicular adenopathy: no palpable lymph nodes Neurologic: Alert and oriented X 3, normal strength and tone. Normal symmetric patellar reflexes.  Cranial nerves: normal, CN 2-12 were intact.  Lab results:  Imaging results:  Ct Head Wo Contrast  11/10/2015  CLINICAL DATA:  Syncope, head injury after fall. No reported loss of consciousness. EXAM: CT HEAD WITHOUT CONTRAST TECHNIQUE: Contiguous axial images were obtained from the base of the skull through the vertex without intravenous contrast. COMPARISON:  CT scan of December 11, 2014. FINDINGS: Mucosal thickening and mucous retention cyst is noted in right maxillary sinus. Right ethmoid sinusitis is noted. Bony calvarium appears intact. No mass effect or midline  shift is noted. Ventricular size is within normal limits. There is no evidence of mass lesion, hemorrhage or acute infarction. IMPRESSION: Right maxillary and ethmoid sinusitis. No acute intracranial abnormality seen. Electronically Signed   By: Marijo Conception, M.D.   On: 11/10/2015 12:52    Other results: EKG:  Assessment & Plan by Problem: Principal Problem:   Syncope Active Problems:   Syncopal episodes  Erica Simmons is a 45 year old female with no significant past medical history other than a 12.5 pack year smoking history, that presents to [facility] ED with 4 syncopal episodes in the past 24 hours w/o a clear trigger for these syncopal episodes. Must not miss things would include malignancy, seizures(esp given her sudden LOC and possible confusion after syncopal episodes), arrhythmias. At this time my  suspicion points towards a neurocardiogenic syncopal cause given the stress trigger that is associated with these recent episodes. Other things to consider would be surgical menopause given her history of total hysterectomy at 45 years old w/non-adherence to hormonal therapy, psychiatric causes, fibromyalgia(dx of exclusion), fatty liver dz(low suspicion given her recent weight gain and mild obesity, BMI 27), adult onset still's disease.  PHQ 9 completed and scored as a 12,difficult to assess clearly based on underlying pain, will reassess after symptoms are managed. Patient states this is secondary to her 2 week history of feeling ill. Her preliminary diagnostic criteria for fibromyalgia and measurement of symptom severity at 9. Her symptom severity scale score is a 9 out of 12. We would still need symptoms to be present at a similar level for at least 3 months and r/o other disorder that would otherwise explain the pain before considering fibromyalgia.   1)Syncopal episodes -Workup for syncope, includes CMP to assess for metabolic derangements, telemetry to assess for cardiac causes of this problem, flu ESR and C-rp to assess inflammatory or rheumatologic processes.   2)Diffuse bone pain -For pain control currently using tramadol.  -F/u labs, CBC, check calcium, phosphorus, and if abnormal consider checking PTH.     This is a Careers information officer Note.  The care of the patient was discussed with Dr.  and the assessment and plan was formulated with their assistance.  Please see their note for official documentation of the patient encounter.   Signed: Delena Serve., Med Student 11/10/2015, 3:47 PM

## 2015-11-10 NOTE — Plan of Care (Signed)
Problem: Pain Managment: Goal: General experience of comfort will improve Outcome: Not Progressing Pt still c/o pain, unrelieved by sch pain meds

## 2015-11-10 NOTE — ED Notes (Signed)
Patient transported to CT 

## 2015-11-10 NOTE — Progress Notes (Signed)
Pt admitted to the unit at 1630. Pt mental status is A&Ox4. Pt oriented to room, staff, and call bell. Skin is intact except as otherwise charted. Full assessment charted in [facility]. Call bell within reach. Visitor guidelines reviewed w/ pt and/or family.

## 2015-11-10 NOTE — H&P (Signed)
Date: 11/10/2015               Patient Name:  Erica Simmons MRN: [id number]  DOB: 12/31/1970 Age / Sex: 45 y.o., female   PCP: No Pcp Per Patient         Medical Service: Internal Medicine Teaching Service         Attending Physician: Dr. Oval Linsey, MD    First Contact: Dr. Burgess Estelle Pager: [phone]  Second Contact: Dr. Osa Craver  Pager: [phone]       After Hours (After 5p/  First Contact Pager: [phone]  weekends / holidays): Second Contact Pager: [phone]   Chief Complaint: syncope, joint pain and fatigue   History of Present Illness:   This is a 45 yo woman who came for syncopal episodes x 4 yesterday. She says  2.5 weeks ago she started having diffuse "bone pain. Then 2 weeks ago, she travelled to [place], [place]  And then she had swelling of her face, and subsequently had a right molar dental extraction last week , then developed a dry socket, and so her dentist in [place] prescribed amoxicillin for 10 days starting 2/2.   She describes her bone pain as achiness and malaise, like "eating bone"  And "bone turning black". She says she has joint pain all over and it is 13/10. Her pain comes in flares. Pain somewhat relieved by advil and norco. She frequently sweats and "gets hot". This has been going on for 2.5 weeks and she wakes up crying She also experiences some anxiety when she wakes up.  She also says when she wakes up, she sometimes has zero energy and she has been "rude and mean" to her husband lately. She has normal sleep. She enjoys doing her activities like mowing her lawn. Appetite has been normal in last few months. She denies any history of foreign travel, and denies any rash. She denies hiking activity in [place]. She denies cough or prodromal symptoms. Her daughter was recently sick. She denies muscle weakness, or difficulty getting up from chair. Her pain included pain around the hip, and in joints all over.   Yesterday she says that she was standing  up and had some argument with her husband about switching her lights off when he was about to go to work, and then she fell down and husband says immediately after she fell down, she "kicked her legs like a donkey" and she hit her head when she fell down before she got up again. 10 seconds prior to the episode, patient had some flushing and lightheadedness on all 4 episodes, but no sensation of room spinning, and also some tingling in her right ear.  She denies b/b incontinence or arm twitching. This happened for few seconds, only when she stood up, but not when she is lying down. Her youngest daughter, who is a premed measured her BP at the time of the episode and it was 143/105. Husband says that she was "talking like a baby" and had some slurred speech during the episode, but it was normalized.  During the time of the episode, she denies headaches, palpitations, chest pain, shortness of breath. She had normal diet yesterday and was not dehydrated.   Patient had a hysterectomy at age 49 due to endometriosis. and was on hormonal therapy for a short time, but it has been at least 10 years since she has been on this. She denies overt history of depression or anxiety. 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 She has gained over 30 pounds in the last year since she recently moved from [place].     Patient did not take flu vaccine. Also, she feels safe at home. She has been married for 25 years.   PMH: endometriosis , gluten intolerance  Past surgical history-   TAH-BSO at age 5 due to endometriosis , FH: diabetes in mother and neuropathy, grandmother has osteoarthritis and 2 mechanical valves. No FH of rheumatologic conditions.  SH: She has atleast 1 daughter. She smokes 1/2 ppd for >15 years. She denies heavy alcohol use and last drink more than 2 weeks ago.         Meds: No current facility-administered medications for this encounter.   Current Outpatient Prescriptions  Medication Sig Dispense  Refill  . amoxicillin (AMOXIL) 500 MG capsule Take 500 mg by mouth 3 (three) times daily.    . Aspirin-Acetaminophen-Caffeine (GOODY HEADACHE PO) Take 1 packet by mouth daily as needed (pain).    Marland Kitchen HYDROcodone-acetaminophen (NORCO/VICODIN) 5-325 MG tablet Take 1 tablet by mouth every 6 (six) hours as needed. 10 tablet 0  . ibuprofen (ADVIL,MOTRIN) 100 MG/5ML suspension Take 200 mg by mouth every 4 (four) hours as needed for mild pain.    Marland Kitchen ibuprofen (ADVIL,MOTRIN) 200 MG tablet Take 800 mg by mouth every 6 (six) hours as needed for moderate pain.      Allergies: Allergies as of 11/10/2015  . (No Known Allergies)   History reviewed. No pertinent past medical history. Past Surgical History  Procedure Laterality Date  . Abdominal hysterectomy  2000    Total Hysterectomy for Endometriosis   Family History  Problem Relation Age of Onset  . Heart disease Maternal Grandmother   . Osteoporosis Maternal Grandmother    Social History   Social History  . Marital Status: Married    Spouse Name: N/A  . Number of Children: N/A  . Years of Education: N/A   Occupational History  . Not on file.   Social History Main Topics  . Smoking status: Current Some Day Smoker -- 0.50 packs/day  . Smokeless tobacco: Not on file  . Alcohol Use: No  . Drug Use: No  . Sexual Activity: Not on file   Other Topics Concern  . Not on file   Social History Narrative    Review of Systems: Pertinent items noted in HPI and remainder of comprehensive ROS otherwise negative.  Physical Exam: Blood pressure 118/87, pulse 82, temperature 98.4 F (36.9 C), temperature source Oral, resp. rate 16, height '5\' 3"'  (1.6 m), weight 155 lb (70.308 kg), SpO2 98 %.  General: Vital signs reviewed. Patient in no acute distress Cardiovascular: regular rate, rhythm, no murmur appreciated  Pulmonary/Chest: Clear to auscultation bilaterally, no wheezes, rales, or rhonchi. Abdominal: Soft, non-tender, non-distended, BS  + Extremities: No lower extremity edema bilaterally, pulses symmetric and intact bilaterally. Skin: Warm, dry and intact. No rashes or erythema.  Neurologic exam: CN II-XII grossly intact DTRs: 2+ and symmetric Sensory: intact to light touch and feels same  Motor: 5/5 strength in upper, 5/5 in lower extremities, normal muscle tone Cerebellar: romberg negative, normal FNF, normal heel to shin    Lab results: Results for orders placed or performed during the hospital encounter of 11/10/15 (from the past 24 hour(s))  CBG monitoring, ED     Status: Abnormal   Collection Time: 11/10/15 11:17 AM  Result Value Ref Range   Glucose-Capillary 113 (H) 65 - 99 mg/dL   Comment  1 Notify RN   Basic metabolic panel     Status: Abnormal   Collection Time: 11/10/15 11:29 AM  Result Value Ref Range   Sodium 142 135 - 145 mmol/L   Potassium 4.8 3.5 - 5.1 mmol/L   Chloride 101 101 - 111 mmol/L   CO2 26 22 - 32 mmol/L   Glucose, Bld 117 (H) 65 - 99 mg/dL   BUN 14 6 - 20 mg/dL   Creatinine, Ser 0.74 0.44 - 1.00 mg/dL   Calcium 9.8 8.9 - 10.3 mg/dL   GFR calc non Af Amer >60 >60 mL/min   GFR calc Af Amer >60 >60 mL/min   Anion gap 15 5 - 15  CBC     Status: None   Collection Time: 11/10/15 11:29 AM  Result Value Ref Range   WBC 7.9 4.0 - 10.5 K/uL   RBC 4.35 3.87 - 5.11 MIL/uL   Hemoglobin 13.5 12.0 - 15.0 g/dL   HCT 40.3 36.0 - 46.0 %   MCV 92.6 78.0 - 100.0 fL   MCH 31.0 26.0 - 34.0 pg   MCHC 33.5 30.0 - 36.0 g/dL   RDW 13.6 11.5 - 15.5 %   Platelets 331 150 - 400 K/uL  Urinalysis, Routine w reflex microscopic (not at [facility])     Status: None   Collection Time: 11/10/15 11:45 AM  Result Value Ref Range   Color, Urine YELLOW YELLOW   APPearance CLEAR CLEAR   Specific Gravity, Urine 1.019 1.005 - 1.030   pH 7.0 5.0 - 8.0   Glucose, UA NEGATIVE NEGATIVE mg/dL   Hgb urine dipstick NEGATIVE NEGATIVE   Bilirubin Urine NEGATIVE NEGATIVE   Ketones, ur NEGATIVE NEGATIVE mg/dL   Protein, ur  NEGATIVE NEGATIVE mg/dL   Nitrite NEGATIVE NEGATIVE   Leukocytes, UA NEGATIVE NEGATIVE  POC urine preg, ED (not at [facility])     Status: None   Collection Time: 11/10/15 11:52 AM  Result Value Ref Range   Preg Test, Ur NEGATIVE NEGATIVE  Hepatic function panel     Status: Abnormal   Collection Time: 11/10/15  1:06 PM  Result Value Ref Range   Total Protein 7.0 6.5 - 8.1 g/dL   Albumin 4.0 3.5 - 5.0 g/dL   AST 51 (H) 15 - 41 U/L   ALT 96 (H) 14 - 54 U/L   Alkaline Phosphatase 63 38 - 126 U/L   Total Bilirubin 0.4 0.3 - 1.2 mg/dL   Bilirubin, Direct <0.1 (L) 0.1 - 0.5 mg/dL   Indirect Bilirubin NOT CALCULATED 0.3 - 0.9 mg/dL  CK     Status: None   Collection Time: 11/10/15  1:06 PM  Result Value Ref Range   Total CK 128 38 - 234 U/L     Imaging results:  Ct Head Wo Contrast  11/10/2015  CLINICAL DATA:  Syncope, head injury after fall. No reported loss of consciousness. EXAM: CT HEAD WITHOUT CONTRAST TECHNIQUE: Contiguous axial images were obtained from the base of the skull through the vertex without intravenous contrast. COMPARISON:  CT scan of December 11, 2014. FINDINGS: Mucosal thickening and mucous retention cyst is noted in right maxillary sinus. Right ethmoid sinusitis is noted. Bony calvarium appears intact. No mass effect or midline shift is noted. Ventricular size is within normal limits. There is no evidence of mass lesion, hemorrhage or acute infarction. IMPRESSION: Right maxillary and ethmoid sinusitis. No acute intracranial abnormality seen. Electronically Signed   By: Marijo Conception, M.D.   On:  11/10/2015 12:52    Other results: EKG: some T wave changes in V3 and v4 as compared to last one.   Assessment & Plan by Problem: Principal Problem:   Syncope Active Problems:   Syncopal episodes   Syncopal episodes: Patient with 4 syncopal episodes lasting 10 seconds in duration yesterday, preceded by some flushing and diaphoresis leading to a fall- CT head negative, witnessed by  her husband. The episode was preceded by some argument with her husband.  Working differential is vasovagal syncope as this happened when she was standing up and had some preceding pallor and ringing in her right ear. Her blood pressure was 143/105 when daughter checked it. She says she was kicking her legs after the syncopal episode, but unlikely to be seizure as she did not have any other seizure symptoms. Unlikely to be orthostatic also. Pt denies any palpitations or chest pain. She does not have history of diabetes. Her neuro exam entirely unremarkable. Cardiac cause, or stroke is very unlikely. Her EKG did show some T wave changes in V3 and V4 as compared to the EKG in March. In the ER, her orthostatics was negative. She had a similar presentation back in March when she had presented with some dizziness. Likely her syncope is vasovagal, exacerbated by her stress due to underlying anxiety and depression. She received 1 L bolus in ER.  -telemetry -morning EKG -repeat CBC and CMET  -zofran 4 mg q8 hours PRN    Diffuse joint pain +/- muscle pain with anxiety and depression like symptoms: patient with 2-3 week history of diffuse joint pain. Pain somewhat relieved with norco and advil. Most likely due to viral infection (parvovirus, etc.) She has no synovitis on exam. She has no major joint erythema or swelling and no rash anywhere. Unlikely to be a rheumatological condition, but we are checking ESR to rule out. She has associated 40 pound weight gain. Her TSH was recently checked and normal. She has mild elevation of LFTs but Tbili normal so we are doing RUQ [place]. Weight gain is concerning for depression as she recently moved from [place] to a new environment. Also, she has a history of endometriosis, and endometriosis has been linked with both depression, and Fibromyalgia.  Other differential is also fibromyalgia due to her age,  diffuse pain, but it is a diagnosis of exclusion, after exclusion of  musculoskeletal conditions like rheumatoid conditions.  Depression and fibromyalgia can co-exist. Unlikely to be Systemic exertional Intolerance disease (formerly known as chronic fatigue syndrome), especially due to her relatively short onset of fatigue. Other differentials would be polymyositis, and sleep apnea. Unlikely to be PMR due to her age. I do not think she has malignancy as she had weight gain. And EBV unlikely as no cervical lymph node enlargement on exam.    -checking ESR and CRP -tramadol 50 mg q6 PRN as it is also effective for fibromyalgia type pain -doing WPS scale for Fibromyalgia  -doing PHQ 9  -doing epworth sleepiness scale   Dental infection -continue 10 day course of amoxicillin prescribed by her dentist which was started on 2/2.   Elevated LFTs: probably secondary to NAFLD, or tylenol use. Mild elevation of AST to 51 and ALT to 96. TBili normal.  CK normal. Her liver enzymes were normal when she came in March 2016.  Differentials include medication use, chronic viral hepatitis (b & c), autoimmune hepatitis, NAFLD, among others/  -repeat CMET -RUQ ultrasound   Left axillary pain: last mammogram 3 years  ago, and per pt she has dense breasts and they were following up ont hat in [place] -needs mammogram referral by PCP   F E N reg  DVT ppx: lovenox Code- full    Dispo: Disposition is deferred at this time, awaiting improvement of current medical problems. Anticipated discharge in approximately 2 day(s).   The patient does have a current PCP (No Pcp Per Patient) and does need an [facility] hospital follow-up appointment after discharge.  The patient does not have transportation limitations that hinder transportation to clinic appointments.  Signed: Burgess Estelle, MD 11/10/2015, 3:50 PM

## 2015-11-10 NOTE — ED Notes (Signed)
Visitor reports that pt has passed out 4 times since yesterday. Visitor reports that one of the episodes of syncope was when pt was arguing with him.

## 2015-11-10 NOTE — ED Provider Notes (Signed)
CSN: [id number]     Arrival date & time 11/10/15  1056 History   First MD Initiated Contact with Patient 11/10/15 1122     Chief Complaint  Patient presents with  . Loss of Consciousness     (Consider location/radiation/quality/duration/timing/severity/associated sxs/prior Treatment) Patient is a 45 y.o. female presenting with syncope.  Loss of Consciousness Episode history:  Multiple Most recent episode:  Yesterday Duration:  30 seconds Timing:  Intermittent Chronicity:  New Context: standing up   Context: not blood draw, not dehydration, not medication change and not sitting down   Witnessed: yes   Relieved by:  None tried Worsened by:  Nothing tried Ineffective treatments:  None tried Associated symptoms: no chest pain, no confusion, no dizziness, no fever, no headaches, no nausea, no seizures and no shortness of breath     History reviewed. No pertinent past medical history. Past Surgical History  Procedure Laterality Date  . Abdominal hysterectomy  2000    Total Hysterectomy for Endometriosis   Family History  Problem Relation Age of Onset  . Heart disease Maternal Grandmother   . Osteoporosis Maternal Grandmother    Social History  Substance Use Topics  . Smoking status: Current Some Day Smoker -- 0.50 packs/day  . Smokeless tobacco: None  . Alcohol Use: No   OB History    No data available     Review of Systems  Constitutional: Positive for chills. Negative for fever.  HENT: Negative for drooling and hearing loss.   Eyes: Negative for pain.  Respiratory: Negative for choking and shortness of breath.   Cardiovascular: Positive for syncope. Negative for chest pain.  Gastrointestinal: Negative for nausea and abdominal pain.  Genitourinary: Negative for dysuria.  Neurological: Positive for syncope. Negative for dizziness, seizures and headaches.  Psychiatric/Behavioral: Negative for confusion. The patient is not hyperactive.   All other systems reviewed and  are negative.     Allergies  Review of patient's allergies indicates no known allergies.  Home Medications   Prior to Admission medications   Medication Sig Start Date End Date Taking? Authorizing Provider  amoxicillin (AMOXIL) 500 MG capsule Take 500 mg by mouth 3 (three) times daily.   Yes Historical Provider, MD  Aspirin-Acetaminophen-Caffeine (GOODY HEADACHE PO) Take 1 packet by mouth daily as needed (pain).   Yes Historical Provider, MD  HYDROcodone-acetaminophen (NORCO/VICODIN) 5-325 MG tablet Take 1 tablet by mouth every 6 (six) hours as needed. 11/06/15  Yes Quintella Reichert, MD  ibuprofen (ADVIL,MOTRIN) 100 MG/5ML suspension Take 200 mg by mouth every 4 (four) hours as needed for mild pain.   Yes Historical Provider, MD  ibuprofen (ADVIL,MOTRIN) 200 MG tablet Take 800 mg by mouth every 6 (six) hours as needed for moderate pain.   Yes Historical Provider, MD   BP 118/87 mmHg  Pulse 82  Temp(Src) 98.4 F (36.9 C) (Oral)  Resp 16  Ht 5\' 3"  (1.6 m)  Wt 155 lb (70.308 kg)  BMI 27.46 kg/m2  SpO2 98% Physical Exam  Constitutional: She is oriented to person, place, and time. She appears well-developed and well-nourished.  HENT:  Head: Normocephalic and atraumatic.  Neck: Normal range of motion.  Cardiovascular: Normal rate and regular rhythm.   Pulmonary/Chest: No stridor. No respiratory distress.  Abdominal: She exhibits no distension.  Musculoskeletal: Normal range of motion. She exhibits no edema or tenderness.  Neurological: She is alert and oriented to person, place, and time.  No altered mental status, able to give full seemingly accurate history.  Face is symmetric, EOM's intact, pupils equal and reactive, vision intact, tongue and uvula midline without deviation Upper and Lower extremity motor 5/5, intact pain perception in distal extremities, 2+ reflexes in biceps, patella and achilles tendons. Finger to nose normal, heel to shin normal.  Nursing note and vitals  reviewed.   ED Course  Procedures (including critical care time) Labs Review Labs Reviewed  BASIC METABOLIC PANEL - Abnormal; Notable for the following:    Glucose, Bld 117 (*)    All other components within normal limits  HEPATIC FUNCTION PANEL - Abnormal; Notable for the following:    AST 51 (*)    ALT 96 (*)    Bilirubin, Direct <0.1 (*)    All other components within normal limits  CBG MONITORING, ED - Abnormal; Notable for the following:    Glucose-Capillary 113 (*)    All other components within normal limits  CBC  URINALYSIS, ROUTINE W REFLEX MICROSCOPIC (NOT AT [facility])  CK  HEPATITIS PANEL, ACUTE  POC URINE PREG, ED    Imaging Review Ct Head Wo Contrast  11/10/2015  CLINICAL DATA:  Syncope, head injury after fall. No reported loss of consciousness. EXAM: CT HEAD WITHOUT CONTRAST TECHNIQUE: Contiguous axial images were obtained from the base of the skull through the vertex without intravenous contrast. COMPARISON:  CT scan of December 11, 2014. FINDINGS: Mucosal thickening and mucous retention cyst is noted in right maxillary sinus. Right ethmoid sinusitis is noted. Bony calvarium appears intact. No mass effect or midline shift is noted. Ventricular size is within normal limits. There is no evidence of mass lesion, hemorrhage or acute infarction. IMPRESSION: Right maxillary and ethmoid sinusitis. No acute intracranial abnormality seen. Electronically Signed   By: Marijo Conception, M.D.   On: 11/10/2015 12:52   I have personally reviewed and evaluated these images and lab results as part of my medical decision-making.   EKG Interpretation   Date/Time:  Saturday November 10 2015 11:14:55 EST Ventricular Rate:  92 PR Interval:  126 QRS Duration: 80 QT Interval:  358 QTC Calculation: 442 R Axis:   89 Text Interpretation:  Poor data quality, interpretation may be  adversely affected Normal sinus rhythm Nonspecific T wave abnormality  Abnormal ECG No significant change since last  tracing 12/2014 Confirmed by  [facility] MD, Corene Cornea [id number]) on 11/10/2015 11:30:59 AM      MDM   Final diagnoses:  Syncope, unspecified syncope type    45 yo F w/ likely viral symptoms, also with elevated liver enzymes, 'bone pain' and multiple nonspecific symptoms presents here with four episodes of syncope without obvious cause. Slightly orthostatic here, but BP checked at home right after syncopal episode and it was norml. No h/o same. No obvious other causes on exam/history. Concern for possible cardiac/arrhythmia causes so will d/w medicine regarding obs for telemetry and echocardiogram.     Merrily Pew, MD 11/10/15 1550

## 2015-11-10 NOTE — Progress Notes (Signed)
Report received from [place] in ED. Pt to be admitted to [facility] room 20.

## 2015-11-11 ENCOUNTER — Observation Stay (HOSPITAL_COMMUNITY): Payer: 59

## 2015-11-11 DIAGNOSIS — Z90710 Acquired absence of both cervix and uterus: Secondary | ICD-10-CM | POA: Diagnosis not present

## 2015-11-11 DIAGNOSIS — R748 Abnormal levels of other serum enzymes: Secondary | ICD-10-CM | POA: Diagnosis not present

## 2015-11-11 DIAGNOSIS — F17210 Nicotine dependence, cigarettes, uncomplicated: Secondary | ICD-10-CM | POA: Diagnosis not present

## 2015-11-11 DIAGNOSIS — R55 Syncope and collapse: Secondary | ICD-10-CM | POA: Diagnosis not present

## 2015-11-11 LAB — CBC
HCT: 37.7 % (ref 36.0–46.0)
Hemoglobin: 12.5 g/dL (ref 12.0–15.0)
MCH: 30.9 pg (ref 26.0–34.0)
MCHC: 33.2 g/dL (ref 30.0–36.0)
MCV: 93.1 fL (ref 78.0–100.0)
PLATELETS: 313 10*3/uL (ref 150–400)
RBC: 4.05 MIL/uL (ref 3.87–5.11)
RDW: 13.8 % (ref 11.5–15.5)
WBC: 7 10*3/uL (ref 4.0–10.5)

## 2015-11-11 LAB — COMPREHENSIVE METABOLIC PANEL
ALBUMIN: 3.4 g/dL — AB (ref 3.5–5.0)
ALK PHOS: 56 U/L (ref 38–126)
ALT: 90 U/L — AB (ref 14–54)
ANION GAP: 10 (ref 5–15)
AST: 61 U/L — ABNORMAL HIGH (ref 15–41)
BILIRUBIN TOTAL: 0.4 mg/dL (ref 0.3–1.2)
BUN: 16 mg/dL (ref 6–20)
CALCIUM: 9.2 mg/dL (ref 8.9–10.3)
CO2: 25 mmol/L (ref 22–32)
CREATININE: 0.7 mg/dL (ref 0.44–1.00)
Chloride: 105 mmol/L (ref 101–111)
GFR calc Af Amer: >60 mL/min (ref >60)
GFR calc non Af Amer: >60 mL/min (ref >60)
GLUCOSE: 98 mg/dL (ref 65–99)
Potassium: 4.7 mmol/L (ref 3.5–5.1)
Sodium: 140 mmol/L (ref 135–145)
TOTAL PROTEIN: 6.1 g/dL — AB (ref 6.5–8.1)

## 2015-11-11 LAB — HEPATITIS PANEL, ACUTE
HEP A IGM: NEGATIVE
HEP B C IGM: NEGATIVE
Hepatitis B Surface Ag: NEGATIVE

## 2015-11-11 LAB — HIV ANTIBODY (ROUTINE TESTING W REFLEX): HIV Screen 4th Generation wRfx: NONREACTIVE

## 2015-11-11 MED ORDER — SALSALATE 750 MG PO TABS: 750.0000 mg | ORAL_TABLET | Freq: Two times a day (BID) | ORAL | Status: DC

## 2015-11-11 MED ORDER — NAPROXEN 250 MG PO TABS
500.0000 mg | ORAL_TABLET | Freq: Two times a day (BID) | ORAL | Status: DC
  Filled 2015-11-11: qty 2

## 2015-11-11 NOTE — Discharge Summary (Signed)
Name: Erica Simmons MRN: [id number] DOB: [birth year] 45 y.o. PCP: No Pcp Per Patient  Date of Admission: 11/10/2015 11:16 AM Date of Discharge: 11/11/2015 Attending Physician: Oval Linsey, MD  Discharge Diagnosis: 1. Vasovagal syncope  Principal Problem:   Syncopal episodes Active Problems:   Tobacco abuse   Elevated liver enzymes   Bone pain  Discharge Medications:   Medication List    STOP taking these medications        GOODY HEADACHE PO     HYDROcodone-acetaminophen 5-325 MG tablet  Commonly known as:  NORCO/VICODIN     ibuprofen 100 MG/5ML suspension  Commonly known as:  ADVIL,MOTRIN     ibuprofen 200 MG tablet  Commonly known as:  ADVIL,MOTRIN      TAKE these medications        amoxicillin 500 MG capsule  Commonly known as:  AMOXIL  Take 500 mg by mouth 3 (three) times daily.     salsalate 750 MG tablet  Commonly known as:  DISALCID  Take 1 tablet (750 mg total) by mouth 2 (two) times daily. As needed for 1 week and follow up with PCP        Disposition and follow-up:   [name] was discharged from [facility] in Good condition.  At the hospital follow up visit please address:  Diffuse bone pain : ESR and CRP WNL. Maybe viral cause that may take time to go away. Explained at length about this and also that if her pain persists then this may be fibromyalgia, and she may have underlying depression and anxiety. TSH is also normal checked 1 week ago . Both PHQ9 for depression and WPS scale for fibromyalgia high. Pl repeat both scales , and pt is sent home on salsalate 750 mg bid with meals and asked to follow up with PCP. She may benefit from amitriptyline.   Leg kicking- may have undiagnosed sleep apnea and/or RLS, recommend a sleep study   Elevated LFTs-needs periodic monitoring - acute hepatitis panel was negative. Left axillary pain- needs repeat mammogram  2.  Labs / imaging needed at time of follow-up:  CMET   3.   Pending labs/ test needing follow-up:   Follow-up Appointments:     Follow-up Information    Follow up with your PCP.      Follow up with [facility].   Why:  If you can not find a PCP, then this is the address   Contact information:   [address] [place] [phone]      Discharge Instructions: Discharge Instructions    Diet - low sodium heart healthy    Complete by:  As directed      Discharge instructions    Complete by:  As directed   Please follow up with your PCP. If you have difficulty setting this up, we have a clinic and this is provided on discharge instructions   We have ruled out a lot of causes for your passing out- like any thing with heart or brain. This may be due to underlying stress and anxiety with some depression.  Your liver enzymes are mildly elevated,- we have ruled out hepatitis. Ultrasound was also normal. This will just need to be monitored with PCP.  You also need repeat mammogram    Also, we think your diffuse bone pain may be due to a viral infection that is taking some time to resolve. But if it persists, then  you may have Fibromyalgia, as the screening questions we asked you for fibromyalgia were positive yesterday- and the questions are very specific for fibromyalgia. Also, your screening for depression was also positive. So for both of this, you may benefit from Amitriptyline low dose at bed time as this will address both causes. For now, take the Salsalate tablet with MEALS for a week     Increase activity slowly    Complete by:  As directed            Consultations:    Procedures Performed:  Dg Chest 2 View  11/06/2015  CLINICAL DATA:  Body aches and fever for 9 days, smoker EXAM: CHEST  2 VIEW COMPARISON:  12/11/2014 FINDINGS: Normal heart size, mediastinal contours, and pulmonary vascularity. Minimal linear subsegmental atelectasis at RIGHT base. Lungs otherwise clear. No pulmonary  infiltrate, pleural effusion or pneumothorax. Bones unremarkable. IMPRESSION: Linear subsegmental atelectasis at base of RIGHT middle lobe. Electronically Signed   By: Lavonia Dana M.D.   On: 11/06/2015 09:32   Ct Head Wo Contrast  11/10/2015  CLINICAL DATA:  Syncope, head injury after fall. No reported loss of consciousness. EXAM: CT HEAD WITHOUT CONTRAST TECHNIQUE: Contiguous axial images were obtained from the base of the skull through the vertex without intravenous contrast. COMPARISON:  CT scan of December 11, 2014. FINDINGS: Mucosal thickening and mucous retention cyst is noted in right maxillary sinus. Right ethmoid sinusitis is noted. Bony calvarium appears intact. No mass effect or midline shift is noted. Ventricular size is within normal limits. There is no evidence of mass lesion, hemorrhage or acute infarction. IMPRESSION: Right maxillary and ethmoid sinusitis. No acute intracranial abnormality seen. Electronically Signed   By: Marijo Conception, M.D.   On: 11/10/2015 12:52   US Abdomen Limited Ruq  11/11/2015  CLINICAL DATA:  Fatigue, abdominal pain.  Abnormal liver functions. EXAM: US ABDOMEN LIMITED - RIGHT UPPER QUADRANT COMPARISON:  None. FINDINGS: Gallbladder: No gallstones or wall thickening visualized. No sonographic Murphy sign noted by sonographer. Common bile duct: Diameter: Normal at 5.6 mm Liver: No focal lesion identified. Within normal limits in parenchymal echogenicity. IMPRESSION: Normal RIGHT upper quadrant ultrasound. Electronically Signed   By: Suzy Bouchard M.D.   On: 11/11/2015 08:26    2D Echo:   Cardiac Cath:   Admission HPI:   This is a 45 yo woman who came for syncopal episodes x 4 yesterday. She says 2.5 weeks ago she started having diffuse "bone pain. Then 2 weeks ago, she travelled to [place], [place] And then she had swelling of her face, and subsequently had a right molar dental extraction last week , then developed a dry socket, and so her dentist in [place]  prescribed amoxicillin for 10 days starting 2/2.   She describes her bone pain as achiness and malaise, like "eating bone" And "bone turning black". She says she has joint pain all over and it is 13/10. Her pain comes in flares. Pain somewhat relieved by advil and norco. She frequently sweats and "gets hot". This has been going on for 2.5 weeks and she wakes up crying She also experiences some anxiety when she wakes up. She also says when she wakes up, she sometimes has zero energy and she has been "rude and mean" to her husband lately. She has normal sleep. She enjoys doing her activities like mowing her lawn. Appetite has been normal in last few months. She denies any history of foreign travel, and denies any rash. She denies  hiking activity in [place]. She denies cough or prodromal symptoms. Her daughter was recently sick. She denies muscle weakness, or difficulty getting up from chair. Her pain included pain around the hip, and in joints all over.   Yesterday she says that she was standing up and had some argument with her husband about switching her lights off when he was about to go to work, and then she fell down and husband says immediately after she fell down, she "kicked her legs like a donkey" and she hit her head when she fell down before she got up again. 10 seconds prior to the episode, patient had some flushing and lightheadedness on all 4 episodes, but no sensation of room spinning, and also some tingling in her right ear. She denies b/b incontinence or arm twitching. This happened for few seconds, only when she stood up, but not when she is lying down. Her youngest daughter, who is a premed measured her BP at the time of the episode and it was 143/105. Husband says that she was "talking like a baby" and had some slurred speech during the episode, but it was normalized. During the time of the episode, she denies headaches, palpitations, chest pain, shortness of breath. She had normal diet  yesterday and was not dehydrated.   Patient had a hysterectomy at age 5 due to endometriosis. and was on hormonal therapy for a short time, but it has been at least 10 years since she has been on this. She denies overt history of depression or anxiety. 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 She has gained over 30 pounds in the last year since she recently moved from [place].    Patient did not take flu vaccine. Also, she feels safe at home. She has been married for 25 years.   PMH: endometriosis , gluten intolerance  Past surgical history- TAH-BSO at age 77 due to endometriosis , FH: diabetes in mother and neuropathy, grandmother has osteoarthritis and 2 mechanical valves. No FH of rheumatologic conditions.  SH: She has atleast 1 daughter. She smokes 1/2 ppd for >15 years. She denies heavy alcohol use and last drink more than 2 weeks ago.      Hospital Course by problem list: Principal Problem:   Syncopal episodes Active Problems:   Tobacco abuse   Elevated liver enzymes   Bone pain   Syncopal episodes: Patient with 4 syncopal episodes lasting 10 seconds in duration yesterday, preceded by some flushing and diaphoresis leading to a fall- CT head negative, witnessed by her husband. The episode was preceded by some argument with her husband.  Pt denies any palpitations or chest pain. She does not have history of diabetes. Her neuro exam entirely unremarkable. Cardiac cause, or stroke is very unlikely. In the ER, her orthostatics was negative. She had a similar presentation back in March when she had presented with some dizziness. Likely her syncope is vasovagal, exacerbated by her stress due to underlying anxiety and depression. She received 1 L bolus in ER. She was monitored overnight, and telemetry review and morning EKG was unremarkable. She was sent home and pt will make appointment with her PCP.     Diffuse joint pain +/- muscle pain with anxiety and depression like symptoms:  patient with 2-3 week history of diffuse joint pain. Pain somewhat relieved with norco and advil. Most likely due to viral infection . She has no synovitis on exam. She has no major joint erythema or swelling and no rash anywhere.  We checked her ESR and CRP so this ruled out rheumatologic conditions. She has associated 40 pound weight gain over last year. Her TSH was recently checked and normal. She has mild elevation of LFTs but Tbili normal so we did RUQ [place] which was unremarkable. Weight gain is concerning for depression as she recently moved from [place] to a new environment. Also, she has a history of endometriosis, and endometriosis has been linked with both depression, and Fibromyalgia. Other differential is also fibromyalgia due to her age, diffuse pain, but it is a diagnosis of exclusion. We did PHQ9 scale for depression and WPS scale for fibromyalgia, and they were both positive. We advised the patient that we were not able to find a definite cause of her pain but we ruled out many things. If this is a viral infection, then this will go away in time, but if it is something like fibromyalgia, then it may persist after few weeks/months, and they should repeat the WPS scale in the outpatient appointment and be started on amitriptyline or something similar for depression and FM.   Leg kicking: she may benefit from sleep study- may have undiagnosed sleep apnea, and or RLS.  Dental infection -We continued 10 day course of amoxicillin prescribed by her dentist which was started on 2/2.   Elevated LFTs: ALT:AST 2:1, maybe NAFLD or tylenol use. Not likely to be alcohol use. Acute hepatitis panel negative. RUQ ultrasound is normal.  Follow up will be outpatient.    Left axillary pain: last mammogram 3 years ago, and per pt she has dense breasts and they were following up ont hat in [place]. She will need mammogram referral by PCP  Discharge Vitals:   BP 106/66 mmHg  Pulse 65  Temp(Src) 97.5 F  (36.4 C) (Oral)  Resp 16  Ht '5\' 3"'  (1.6 m)  Wt 165 lb 2 oz (74.9 kg)  BMI 29.26 kg/m2  SpO2 96%  Discharge Labs:  Results for orders placed or performed during the hospital encounter of 11/10/15 (from the past 24 hour(s))  CBG monitoring, ED     Status: Abnormal   Collection Time: 11/10/15 11:17 AM  Result Value Ref Range   Glucose-Capillary 113 (H) 65 - 99 mg/dL   Comment 1 Notify RN   Basic metabolic panel     Status: Abnormal   Collection Time: 11/10/15 11:29 AM  Result Value Ref Range   Sodium 142 135 - 145 mmol/L   Potassium 4.8 3.5 - 5.1 mmol/L   Chloride 101 101 - 111 mmol/L   CO2 26 22 - 32 mmol/L   Glucose, Bld 117 (H) 65 - 99 mg/dL   BUN 14 6 - 20 mg/dL   Creatinine, Ser 0.74 0.44 - 1.00 mg/dL   Calcium 9.8 8.9 - 10.3 mg/dL   GFR calc non Af Amer >60 >60 mL/min   GFR calc Af Amer >60 >60 mL/min   Anion gap 15 5 - 15  CBC     Status: None   Collection Time: 11/10/15 11:29 AM  Result Value Ref Range   WBC 7.9 4.0 - 10.5 K/uL   RBC 4.35 3.87 - 5.11 MIL/uL   Hemoglobin 13.5 12.0 - 15.0 g/dL   HCT 40.3 36.0 - 46.0 %   MCV 92.6 78.0 - 100.0 fL   MCH 31.0 26.0 - 34.0 pg   MCHC 33.5 30.0 - 36.0 g/dL   RDW 13.6 11.5 - 15.5 %   Platelets 331 150 - 400 K/uL  Urinalysis,  Routine w reflex microscopic (not at [facility])     Status: None   Collection Time: 11/10/15 11:45 AM  Result Value Ref Range   Color, Urine YELLOW YELLOW   APPearance CLEAR CLEAR   Specific Gravity, Urine 1.019 1.005 - 1.030   pH 7.0 5.0 - 8.0   Glucose, UA NEGATIVE NEGATIVE mg/dL   Hgb urine dipstick NEGATIVE NEGATIVE   Bilirubin Urine NEGATIVE NEGATIVE   Ketones, ur NEGATIVE NEGATIVE mg/dL   Protein, ur NEGATIVE NEGATIVE mg/dL   Nitrite NEGATIVE NEGATIVE   Leukocytes, UA NEGATIVE NEGATIVE  POC urine preg, ED (not at [facility]     Status: None   Collection Time: 11/10/15 11:52 AM  Result Value Ref Range   Preg Test, Ur NEGATIVE NEGATIVE  Hepatic function panel     Status: Abnormal   Collection Time:  11/10/15  1:06 PM  Result Value Ref Range   Total Protein 7.0 6.5 - 8.1 g/dL   Albumin 4.0 3.5 - 5.0 g/dL   AST 51 (H) 15 - 41 U/L   ALT 96 (H) 14 - 54 U/L   Alkaline Phosphatase 63 38 - 126 U/L   Total Bilirubin 0.4 0.3 - 1.2 mg/dL   Bilirubin, Direct <0.1 (L) 0.1 - 0.5 mg/dL   Indirect Bilirubin NOT CALCULATED 0.3 - 0.9 mg/dL  CK     Status: None   Collection Time: 11/10/15  1:06 PM  Result Value Ref Range   Total CK 128 38 - 234 U/L  Hepatitis panel, acute     Status: None   Collection Time: 11/10/15  1:50 PM  Result Value Ref Range   Hepatitis B Surface Ag Negative Negative   HCV Ab <0.1 0.0 - 0.9 s/co ratio   Hep A IgM Negative Negative   Hep B C IgM Negative Negative  HIV antibody     Status: None   Collection Time: 11/10/15  4:57 PM  Result Value Ref Range   HIV Screen 4th Generation wRfx Non Reactive Non Reactive  Sedimentation rate     Status: None   Collection Time: 11/10/15  4:57 PM  Result Value Ref Range   Sed Rate 22 0 - 22 mm/hr  C-reactive protein     Status: None   Collection Time: 11/10/15  4:57 PM  Result Value Ref Range   CRP 0.8 <1.0 mg/dL  Comprehensive metabolic panel     Status: Abnormal   Collection Time: 11/11/15  6:04 AM  Result Value Ref Range   Sodium 140 135 - 145 mmol/L   Potassium 4.7 3.5 - 5.1 mmol/L   Chloride 105 101 - 111 mmol/L   CO2 25 22 - 32 mmol/L   Glucose, Bld 98 65 - 99 mg/dL   BUN 16 6 - 20 mg/dL   Creatinine, Ser 0.70 0.44 - 1.00 mg/dL   Calcium 9.2 8.9 - 10.3 mg/dL   Total Protein 6.1 (L) 6.5 - 8.1 g/dL   Albumin 3.4 (L) 3.5 - 5.0 g/dL   AST 61 (H) 15 - 41 U/L   ALT 90 (H) 14 - 54 U/L   Alkaline Phosphatase 56 38 - 126 U/L   Total Bilirubin 0.4 0.3 - 1.2 mg/dL   GFR calc non Af Amer >60 >60 mL/min   GFR calc Af Amer >60 >60 mL/min   Anion gap 10 5 - 15  CBC     Status: None   Collection Time: 11/11/15  6:04 AM  Result Value Ref Range   WBC  7.0 4.0 - 10.5 K/uL   RBC 4.05 3.87 - 5.11 MIL/uL   Hemoglobin 12.5 12.0 -  15.0 g/dL   HCT 37.7 36.0 - 46.0 %   MCV 93.1 78.0 - 100.0 fL   MCH 30.9 26.0 - 34.0 pg   MCHC 33.2 30.0 - 36.0 g/dL   RDW 13.8 11.5 - 15.5 %   Platelets 313 150 - 400 K/uL    Signed: Burgess Estelle, MD 11/11/2015, 10:27 AM    Services Ordered on Discharge:  Equipment Ordered on Discharge:

## 2015-11-11 NOTE — Progress Notes (Signed)
Subjective: Patient seen and examined at bedside. There were no acute events overnight. Pt feeling well. Telemetry showed no concerning recordings. EKG unremarkable.  We have ruled out a lot of things, and she is ready to go home Has a pcp but offered our clinic info as it seemed that they were having difficulty finding one.  Objective: Vital signs in last 24 hours: Filed Vitals:   11/10/15 1643 11/10/15 2131 11/10/15 2132 11/11/15 0536  BP:  90/46 108/67 106/66  Pulse:  87 75 65  Temp:  97.7 F (36.5 C)  97.5 F (36.4 C)  TempSrc:  Oral  Oral  Resp:  16  16  Height: '5\' 3"'  (1.6 m)     Weight: 165 lb 2 oz (74.9 kg)     SpO2:  95%  96%   Weight change:   Intake/Output Summary (Last 24 hours) at 11/11/15 1007 Last data filed at 11/10/15 2104  Gross per 24 hour  Intake    100 ml  Output      0 ml  Net    100 ml   General: Vital signs reviewed. Patient in no acute distress Cardiovascular: regular rate, rhythm, no murmur appreciated  Pulmonary/Chest: Clear to auscultation bilaterally, no wheezes, rales, or rhonchi. Abdominal: Soft, non-tender, non-distended, BS +, no hepatomegaly Extremities: No lower extremity edema bilaterally, pulses symmetric and intact bilaterally. Skin: Warm, dry and intact. No rashes or erythema.   Lab Results: Results for orders placed or performed during the hospital encounter of 11/10/15 (from the past 24 hour(s))  CBG monitoring, ED     Status: Abnormal   Collection Time: 11/10/15 11:17 AM  Result Value Ref Range   Glucose-Capillary 113 (H) 65 - 99 mg/dL   Comment 1 Notify RN   Basic metabolic panel     Status: Abnormal   Collection Time: 11/10/15 11:29 AM  Result Value Ref Range   Sodium 142 135 - 145 mmol/L   Potassium 4.8 3.5 - 5.1 mmol/L   Chloride 101 101 - 111 mmol/L   CO2 26 22 - 32 mmol/L   Glucose, Bld 117 (H) 65 - 99 mg/dL   BUN 14 6 - 20 mg/dL   Creatinine, Ser 0.74 0.44 - 1.00 mg/dL   Calcium 9.8 8.9 - 10.3 mg/dL   GFR  calc non Af Amer >60 >60 mL/min   GFR calc Af Amer >60 >60 mL/min   Anion gap 15 5 - 15  CBC     Status: None   Collection Time: 11/10/15 11:29 AM  Result Value Ref Range   WBC 7.9 4.0 - 10.5 K/uL   RBC 4.35 3.87 - 5.11 MIL/uL   Hemoglobin 13.5 12.0 - 15.0 g/dL   HCT 40.3 36.0 - 46.0 %   MCV 92.6 78.0 - 100.0 fL   MCH 31.0 26.0 - 34.0 pg   MCHC 33.5 30.0 - 36.0 g/dL   RDW 13.6 11.5 - 15.5 %   Platelets 331 150 - 400 K/uL  Urinalysis, Routine w reflex microscopic (not at [facility])     Status: None   Collection Time: 11/10/15 11:45 AM  Result Value Ref Range   Color, Urine YELLOW YELLOW   APPearance CLEAR CLEAR   Specific Gravity, Urine 1.019 1.005 - 1.030   pH 7.0 5.0 - 8.0   Glucose, UA NEGATIVE NEGATIVE mg/dL   Hgb urine dipstick NEGATIVE NEGATIVE   Bilirubin Urine NEGATIVE NEGATIVE   Ketones, ur NEGATIVE NEGATIVE mg/dL   Protein, ur NEGATIVE NEGATIVE  mg/dL   Nitrite NEGATIVE NEGATIVE   Leukocytes, UA NEGATIVE NEGATIVE  POC urine preg, ED (not at [facility])     Status: None   Collection Time: 11/10/15 11:52 AM  Result Value Ref Range   Preg Test, Ur NEGATIVE NEGATIVE  Hepatic function panel     Status: Abnormal   Collection Time: 11/10/15  1:06 PM  Result Value Ref Range   Total Protein 7.0 6.5 - 8.1 g/dL   Albumin 4.0 3.5 - 5.0 g/dL   AST 51 (H) 15 - 41 U/L   ALT 96 (H) 14 - 54 U/L   Alkaline Phosphatase 63 38 - 126 U/L   Total Bilirubin 0.4 0.3 - 1.2 mg/dL   Bilirubin, Direct <0.1 (L) 0.1 - 0.5 mg/dL   Indirect Bilirubin NOT CALCULATED 0.3 - 0.9 mg/dL  CK     Status: None   Collection Time: 11/10/15  1:06 PM  Result Value Ref Range   Total CK 128 38 - 234 U/L  Hepatitis panel, acute     Status: None   Collection Time: 11/10/15  1:50 PM  Result Value Ref Range   Hepatitis B Surface Ag Negative Negative   HCV Ab <0.1 0.0 - 0.9 s/co ratio   Hep A IgM Negative Negative   Hep B C IgM Negative Negative  HIV antibody     Status: None   Collection Time: 11/10/15  4:57 PM    Result Value Ref Range   HIV Screen 4th Generation wRfx Non Reactive Non Reactive  Sedimentation rate     Status: None   Collection Time: 11/10/15  4:57 PM  Result Value Ref Range   Sed Rate 22 0 - 22 mm/hr  C-reactive protein     Status: None   Collection Time: 11/10/15  4:57 PM  Result Value Ref Range   CRP 0.8 <1.0 mg/dL  Comprehensive metabolic panel     Status: Abnormal   Collection Time: 11/11/15  6:04 AM  Result Value Ref Range   Sodium 140 135 - 145 mmol/L   Potassium 4.7 3.5 - 5.1 mmol/L   Chloride 105 101 - 111 mmol/L   CO2 25 22 - 32 mmol/L   Glucose, Bld 98 65 - 99 mg/dL   BUN 16 6 - 20 mg/dL   Creatinine, Ser 0.70 0.44 - 1.00 mg/dL   Calcium 9.2 8.9 - 10.3 mg/dL   Total Protein 6.1 (L) 6.5 - 8.1 g/dL   Albumin 3.4 (L) 3.5 - 5.0 g/dL   AST 61 (H) 15 - 41 U/L   ALT 90 (H) 14 - 54 U/L   Alkaline Phosphatase 56 38 - 126 U/L   Total Bilirubin 0.4 0.3 - 1.2 mg/dL   GFR calc non Af Amer >60 >60 mL/min   GFR calc Af Amer >60 >60 mL/min   Anion gap 10 5 - 15  CBC     Status: None   Collection Time: 11/11/15  6:04 AM  Result Value Ref Range   WBC 7.0 4.0 - 10.5 K/uL   RBC 4.05 3.87 - 5.11 MIL/uL   Hemoglobin 12.5 12.0 - 15.0 g/dL   HCT 37.7 36.0 - 46.0 %   MCV 93.1 78.0 - 100.0 fL   MCH 30.9 26.0 - 34.0 pg   MCHC 33.2 30.0 - 36.0 g/dL   RDW 13.8 11.5 - 15.5 %   Platelets 313 150 - 400 K/uL    Micro Results: No results found for this or any previous visit (from  the past 240 hour(s)). Studies/Results: Ct Head Wo Contrast  11/10/2015  CLINICAL DATA:  Syncope, head injury after fall. No reported loss of consciousness. EXAM: CT HEAD WITHOUT CONTRAST TECHNIQUE: Contiguous axial images were obtained from the base of the skull through the vertex without intravenous contrast. COMPARISON:  CT scan of December 11, 2014. FINDINGS: Mucosal thickening and mucous retention cyst is noted in right maxillary sinus. Right ethmoid sinusitis is noted. Bony calvarium appears intact. No mass  effect or midline shift is noted. Ventricular size is within normal limits. There is no evidence of mass lesion, hemorrhage or acute infarction. IMPRESSION: Right maxillary and ethmoid sinusitis. No acute intracranial abnormality seen. Electronically Signed   By: Marijo Conception, M.D.   On: 11/10/2015 12:52   US Abdomen Limited Ruq  11/11/2015  CLINICAL DATA:  Fatigue, abdominal pain.  Abnormal liver functions. EXAM: US ABDOMEN LIMITED - RIGHT UPPER QUADRANT COMPARISON:  None. FINDINGS: Gallbladder: No gallstones or wall thickening visualized. No sonographic Murphy sign noted by sonographer. Common bile duct: Diameter: Normal at 5.6 mm Liver: No focal lesion identified. Within normal limits in parenchymal echogenicity. IMPRESSION: Normal RIGHT upper quadrant ultrasound. Electronically Signed   By: Suzy Bouchard M.D.   On: 11/11/2015 08:26   Medications: I have reviewed the patient's current medications. Scheduled Meds: . amoxicillin  500 mg Oral TID  . enoxaparin (LOVENOX) injection  40 mg Subcutaneous Q24H  . naproxen  500 mg Oral BID WC  . nicotine  7 mg Transdermal Daily  . sodium chloride flush  3 mL Intravenous Q12H   Continuous Infusions:  PRN Meds:.ondansetron, traMADol Assessment/Plan: Principal Problem:   Syncopal episodes Active Problems:   Tobacco abuse   Elevated liver enzymes   Bone pain  Syncopal episode x4: likely vasovagal due to stress and pain. Ruled out cardiac causes. No seizure like activity overnight. Pt feeling better in morning.    Diffuse bone pain- ESR and CRP WNL. Maybe viral cause that may take time to go away. Explained at length about this and also that if her pain persists then this may be fibromyalgia, and she may have underlying depression and anxiety. Says tramadol giving her headache. TSH is also normal checked 1 week ago . Both PHQ9 for depression and WPS scale for fibromyalgia high.   -sending home on salsalate 750 mg bid with meals -follow up with  PCP. May need amitriptyline at bedtime   Left axillary pain -f/u mammogram referral outpatient  Elevated LFTs: ALT:AST 2:1, maybe NAFLD or tylenol use. Not likely to be alcohol use. Acute hepatitis panel negative. RUQ ultrasound is normal.  - outpatient follow up  Dental infection -follow up with dentist and finish amoxicillin     Dispo: Disposition is deferred at this time, awaiting improvement of current medical problems.  Anticipated discharge in approximately 0 day(s).   The patient does have a current PCP (No Pcp Per Patient) and does need an [facility] hospital follow-up appointment after discharge.  The patient does not have transportation limitations that hinder transportation to clinic appointments.  .Services Needed at time of discharge: Y = Yes, Blank = No PT:   OT:   RN:   Equipment:   Other:       Erica Estelle, MD 11/11/2015, 10:07 AM

## 2015-11-11 NOTE — Progress Notes (Signed)
Pt given discharge instructions, prescriptions, and care notes. Pt verbalized understanding AEB no further questions or concerns at this time. IV was discontinued, no redness, pain, or swelling noted at this time. Telemetry discontinued and Centralized Telemetry was notified. Pt left the floor via wheelchair with staff in stable condition. 

## 2015-11-11 NOTE — Discharge Instructions (Signed)
Please follow up with your PCP. If you have difficulty setting this up, we have a clinic and this is provided on discharge instructions   We have ruled out a lot of causes for your passing out- like any thing with heart or brain. This may be due to underlying stress and anxiety with some depression.  Your liver enzymes are mildly elevated,- we have ruled out hepatitis. Ultrasound was also normal. This will just need to be monitored with PCP.  You also need repeat mammogram    Also, we think your diffuse bone pain may be due to a viral infection that is taking some time to resolve. But if it persists, then you may have Fibromyalgia, as the screening questions we asked you for fibromyalgia were positive yesterday- and the questions are very specific for fibromyalgia. Also, your screening for depression was also positive. So for both of this, you may benefit from Amitriptyline low dose at bed time as this will address both causes. For now, take the Salsalate tablet with MEALS for a week

## 2015-12-05 ENCOUNTER — Encounter: Payer: Self-pay | Admitting: Neurology

## 2015-12-05 ENCOUNTER — Ambulatory Visit (INDEPENDENT_AMBULATORY_CARE_PROVIDER_SITE_OTHER): Payer: 59 | Admitting: Neurology

## 2015-12-05 VITALS — BP 111/73 | HR 77 | Ht 63.0 in | Wt 164.0 lb

## 2015-12-05 DIAGNOSIS — M79606 Pain in leg, unspecified: Secondary | ICD-10-CM | POA: Diagnosis not present

## 2015-12-05 DIAGNOSIS — G2581 Restless legs syndrome: Secondary | ICD-10-CM | POA: Insufficient documentation

## 2015-12-05 DIAGNOSIS — M79609 Pain in unspecified limb: Secondary | ICD-10-CM | POA: Insufficient documentation

## 2015-12-05 MED ORDER — DULOXETINE HCL 60 MG PO CPEP: 60.0000 mg | ORAL_CAPSULE | Freq: Every day | ORAL | Status: DC

## 2015-12-05 MED ORDER — ROPINIROLE HCL 0.5 MG PO TABS: 1.0000 mg | ORAL_TABLET | Freq: Every day | ORAL | Status: DC

## 2015-12-05 MED ORDER — ROPINIROLE HCL 0.5 MG PO TABS: 1.0000 mg | ORAL_TABLET | Freq: Three times a day (TID) | ORAL | Status: DC

## 2015-12-05 NOTE — Progress Notes (Signed)
PATIENT: Erica Simmons DOB: April 16, 1971  Chief Complaint  Patient presents with  . Full Body Pain    Says was diagnosed with fibromyalgia a few years ago but this pain is different.  She feels the pain is in her bone, especially neck, back and bilateral legs.  She often feels weak and and is easily fatigued.  . Loss of Consciousness    Orhtostatic Vitals: Lying 111/73, 77 - Sitting 110/75, 81 - Standing 117/76, 87. Reports she has passed out 8 times in the last 2.5 weeks.  Says this is not related to positional changes but feels it more likely caused by her pain and anxiety.  She has recently been evaluated in the ED and says the workup was normal.     HISTORICAL  Erica Simmons is a 45 years old right-handed female, seen in refer by her primary care physician Dr. Wende Neighbors for evaluation of body achy pain, loss of consciousness episodes  in December 05 2015  She reported long-standing history of restless leg symptoms, she often feel numbness tingling urge to move when she sits for prolonged period of time, or lying at bed trying to go to sleep, she has been taking Percocet as needed for symptoms control, but overall she is very active, there was no significant limitation in her daily activity until September 2016, her lower extremity pain has significantly changed, she felt deep achy pain in her bones," as if termites are in my bones", sometimes she woke up in the middle of the night broke into sweat because the severity of the pain, difficulty getting out of the bed, putting weight on her feet in the morning times because of bilateral feet pain, also complains similar but lesser degree of deep muscle bone pain at bilateral upper extremity,  But she gets going, she is active all day long, worried about if she sits down, the symptoms would recur, she feel tired, exhausted,   She denies significant muscle weakness, she has tried Lyrica in the past, complained of blurry vision, ibuprofen  significant GI side effect, tramadol, causing bad headaches,  She reported similar symptoms in her grandmother, and her daughter, I reviewed laboratory evaluation in February 2017, mild abnormal liver functional test otherwise normal CMP, CBC, TSH, negative HIV, ESR, C-reactive protein, CPK  REVIEW OF SYSTEMS: Full 14 system review of systems performed and notable only for fever, weight gain, fatigue, constipation, diarrhea, flushing, easy bruising, joint pain, joint swelling, aching muscles, allergy, headaches, numbness, weakness, passing out, insomnia, sleepiness, restless leg, depression, anxiety, decreased energy, racing thoughts  ALLERGIES: No Known Allergies  HOME MEDICATIONS: Current Outpatient Prescriptions  Medication Sig Dispense Refill  . VITAMIN D, ERGOCALCIFEROL, PO Take 5,000 Units by mouth.     No current facility-administered medications for this visit.    PAST MEDICAL HISTORY: Past Medical History  Diagnosis Date  . Prediabetes   . Hyperlipemia   . Fibromyalgia   . Elevated liver enzymes   . Syncope     PAST SURGICAL HISTORY: Past Surgical History  Procedure Laterality Date  . Abdominal hysterectomy  2000    Total Hysterectomy for Endometriosis  . Ulnar nerve repair      FAMILY HISTORY: Family History  Problem Relation Age of Onset  . Heart disease Maternal Grandmother   . Osteoporosis Maternal Grandmother   . Liver disease Mother   . Diabetes Mother   . Diabetes Maternal Grandfather   . Heart disease Paternal Grandfather   . Breast cancer  Maternal Aunt   . Ovarian cancer Sister   . Osteoporosis Maternal Grandmother     SOCIAL HISTORY:  Social History   Social History  . Marital Status: Married    Spouse Name: N/A  . Number of Children: 2  . Years of Education: HS   Occupational History  . Homemaker    Social History Main Topics  . Smoking status: Current Some Day Smoker -- 0.50 packs/day  . Smokeless tobacco: Not on file  . Alcohol  Use: 0.0 oz/week    0 Standard drinks or equivalent per week     Comment: 1-2 times per month  . Drug Use: No  . Sexual Activity: Not on file   Other Topics Concern  . Not on file   Social History Narrative   Lives at home with husband and daughter.   Right-handed.   2 cups caffeine daily.     PHYSICAL EXAM   Filed Vitals:   12/05/15 0753  BP: 111/73  Pulse: 77  Height: '5\' 3"'  (1.6 m)  Weight: 164 lb (74.39 kg)    Not recorded      Body mass index is 29.06 kg/(m^2).  PHYSICAL EXAMNIATION:  Gen: NAD, conversant, well nourised, obese, well groomed                     Cardiovascular: Regular rate rhythm, no peripheral edema, warm, nontender. Eyes: Conjunctivae clear without exudates or hemorrhage Neck: Supple, no carotid bruise. Pulmonary: Clear to auscultation bilaterally   NEUROLOGICAL EXAM:  MENTAL STATUS: Speech:    Speech is normal; fluent and spontaneous with normal comprehension.  Cognition:     Orientation to time, place and person     Normal recent and remote memory     Normal Attention span and concentration     Normal Language, naming, repeating,spontaneous speech     Fund of knowledge   CRANIAL NERVES: CN II: Visual fields are full to confrontation. Fundoscopic exam is normal with sharp discs and no vascular changes. Pupils are round equal and briskly reactive to light. CN III, IV, VI: extraocular movement are normal. No ptosis. CN V: Facial sensation is intact to pinprick in all 3 divisions bilaterally. Corneal responses are intact.  CN VII: Face is symmetric with normal eye closure and smile. CN VIII: Hearing is normal to rubbing fingers CN IX, X: Palate elevates symmetrically. Phonation is normal. CN XI: Head turning and shoulder shrug are intact CN XII: Tongue is midline with normal movements and no atrophy.  MOTOR: There is no pronator drift of out-stretched arms. Muscle bulk and tone are normal. Muscle strength is  normal.  REFLEXES: Reflexes are 2+ and symmetric at the biceps, triceps, knees, and ankles. Plantar responses are flexor.  SENSORY: Intact to light touch, pinprick, position sense, and vibration sense are intact in fingers and toes.  COORDINATION: Rapid alternating movements and fine finger movements are intact. There is no dysmetria on finger-to-nose and heel-knee-shin.    GAIT/STANCE: Posture is normal. Gait is steady with normal steps, base, arm swing, and turning. Heel and toe walking are normal. Tandem gait is normal.  Romberg is absent.   DIAGNOSTIC DATA (LABS, IMAGING, TESTING) - I reviewed patient records, labs, notes, testing and imaging myself where available.   ASSESSMENT AND PLAN  Erica Simmons is a 45 y.o. female   Restless leg syndrome  Laboratory evaluations, B12, vitamin D, ferritin level  Cymbalta titrating to 60 mg every morning  Requip 0.5 mg up  to 2 tablets every night  EMG nerve conduction study   Marcial Pacas, M.D. Ph.D.  [facility] [address], [place], [place] [address] Ph: [phone] Fax: [phone]  CC: Celene Squibb, MD

## 2015-12-06 LAB — IRON AND TIBC
IRON: 68 ug/dL (ref 27–159)
Iron Saturation: 18 % (ref 15–55)
Total Iron Binding Capacity: 388 ug/dL (ref 250–450)
UIBC: 320 ug/dL (ref 131–425)

## 2015-12-06 LAB — FERRITIN: Ferritin: 69 ng/mL (ref 15–150)

## 2015-12-06 LAB — VITAMIN B12: VITAMIN B 12: 1971 pg/mL — AB (ref 211–946)

## 2015-12-06 LAB — VITAMIN D 25 HYDROXY (VIT D DEFICIENCY, FRACTURES): VIT D 25 HYDROXY: 30 ng/mL (ref 30.0–100.0)

## 2015-12-07 LAB — VITAMIN D 25 HYDROXY (VIT D DEFICIENCY, FRACTURES)

## 2015-12-07 LAB — FERRITIN

## 2015-12-07 LAB — VITAMIN B12

## 2016-01-01 ENCOUNTER — Ambulatory Visit (INDEPENDENT_AMBULATORY_CARE_PROVIDER_SITE_OTHER): Payer: Self-pay | Admitting: Neurology

## 2016-01-01 ENCOUNTER — Ambulatory Visit (INDEPENDENT_AMBULATORY_CARE_PROVIDER_SITE_OTHER): Payer: 59 | Admitting: Neurology

## 2016-01-01 DIAGNOSIS — M79606 Pain in leg, unspecified: Secondary | ICD-10-CM

## 2016-01-01 DIAGNOSIS — R202 Paresthesia of skin: Secondary | ICD-10-CM

## 2016-01-01 DIAGNOSIS — G2581 Restless legs syndrome: Secondary | ICD-10-CM

## 2016-01-01 DIAGNOSIS — Z0289 Encounter for other administrative examinations: Secondary | ICD-10-CM

## 2016-01-01 NOTE — Procedures (Signed)
   NCS (NERVE CONDUCTION STUDY) WITH EMG (ELECTROMYOGRAPHY) REPORT   STUDY DATE: January 01 2016 PATIENT NAME: Erica Simmons DOB: [birth year] MRN: [id number]    TECHNOLOGIST: Laretta Alstrom ELECTROMYOGRAPHER: Marcial Pacas M.D.  CLINICAL INFORMATION:  45 years old female with bilateral lower extremity discomfort consistent with restless leg syndrome, also intermittent left arm pain neck pain  FINDINGS: NERVE CONDUCTION STUDY: Bilateral peroneal sensory responses were normal. Bilateral peroneal to EDB, and tibial motor responses were normal. Bilateral tibial H reflexes were normal and symmetric.  Bilateral median, ulnar sensory and motor responses were normal.  NEEDLE ELECTROMYOGRAPHY: Selective needle examinations was performed at left upper and lower extremity muscles, left cervical paraspinal muscles, left lumbosacral paraspinal muscles.  Needle examination of left tibialis anterior, tibialis posterior, medial gastrocnemius, vastus lateralis, biceps femoris long head was normal.  There was no spontaneous activity at left lumbosacral paraspinal muscles, left L4-5 S1.  Selected needle examination of left pronator teres, extensor digitorum communis, first dorsal interossei, biceps, triceps, deltoid was normal.  There was no spontaneous activity at left cervical paraspinal muscles, left C5-6 and 7.  IMPRESSION:   This is a normal study, there is no electrodiagnostic evidence of left cervical radiculopathy, left lumbosacral radiculopathy, or large fiber peripheral neuropathy.   INTERPRETING PHYSICIAN:   Marcial Pacas M.D. Ph.D. [facility] [address] [place], [place] [address] [phone]

## 2016-01-01 NOTE — Progress Notes (Signed)
EMG nerve conduction study today is normal, please see separate report.

## 2016-01-07 ENCOUNTER — Ambulatory Visit: Payer: 59 | Admitting: Neurology

## 2016-02-21 ENCOUNTER — Emergency Department (HOSPITAL_COMMUNITY): Payer: 59

## 2016-02-21 ENCOUNTER — Emergency Department (HOSPITAL_COMMUNITY)
Admission: EM | Admit: 2016-02-21 | Discharge: 2016-02-21 | Disposition: A | Discharge status: Home / Self Care | Payer: 59 | Attending: Emergency Medicine | Admitting: Emergency Medicine

## 2016-02-21 ENCOUNTER — Encounter (HOSPITAL_COMMUNITY): Payer: Self-pay | Admitting: Emergency Medicine

## 2016-02-21 DIAGNOSIS — E785 Hyperlipidemia, unspecified: Secondary | ICD-10-CM | POA: Diagnosis not present

## 2016-02-21 DIAGNOSIS — M545 Low back pain, unspecified: Secondary | ICD-10-CM

## 2016-02-21 DIAGNOSIS — Z79899 Other long term (current) drug therapy: Secondary | ICD-10-CM | POA: Diagnosis not present

## 2016-02-21 DIAGNOSIS — F17200 Nicotine dependence, unspecified, uncomplicated: Secondary | ICD-10-CM | POA: Diagnosis not present

## 2016-02-21 HISTORY — DX: Restless legs syndrome: G25.81

## 2016-02-21 LAB — URINALYSIS, ROUTINE W REFLEX MICROSCOPIC
BILIRUBIN URINE: NEGATIVE
Glucose, UA: NEGATIVE mg/dL
KETONES UR: NEGATIVE mg/dL
Leukocytes, UA: NEGATIVE
NITRITE: NEGATIVE
PH: 6 (ref 5.0–8.0)
PROTEIN: NEGATIVE mg/dL
Specific Gravity, Urine: 1.02 (ref 1.005–1.030)

## 2016-02-21 LAB — URINE MICROSCOPIC-ADD ON

## 2016-02-21 MED ORDER — METHOCARBAMOL 500 MG PO TABS: 500.0000 mg | ORAL_TABLET | Freq: Two times a day (BID) | ORAL | Status: DC

## 2016-02-21 MED ORDER — NAPROXEN 500 MG PO TABS: 500.0000 mg | ORAL_TABLET | Freq: Two times a day (BID) | ORAL | Status: DC

## 2016-02-21 NOTE — ED Provider Notes (Signed)
CSN: [id number]     Arrival date & time 02/21/16  1500 History   First MD Initiated Contact with Patient 02/21/16 1555     Chief Complaint  Patient presents with  . Back Pain    Patient is a 45 y.o. female presenting with back pain.  Back Pain  Pt is having pain in her tailbone. Symptoms started about 2 weeks ago. She does not recall any falls or injuries. It hurts to have a BM.  She also has pain when she hits a bump.  Sitting in that position increases the pain.  No fever.  No vomiting.No numbness or weakness. No pain. No dysuria. Past Medical History  Diagnosis Date  . Prediabetes   . Hyperlipemia   . Fibromyalgia   . Elevated liver enzymes   . Syncope   . Restless leg syndrome    Past Surgical History  Procedure Laterality Date  . Abdominal hysterectomy  2000    Total Hysterectomy for Endometriosis  . Ulnar nerve repair     Family History  Problem Relation Age of Onset  . Heart disease Maternal Grandmother   . Osteoporosis Maternal Grandmother   . Liver disease Mother   . Diabetes Mother   . Diabetes Maternal Grandfather   . Heart disease Paternal Grandfather   . Breast cancer Maternal Aunt   . Ovarian cancer Sister   . Osteoporosis Maternal Grandmother    Social History  Substance Use Topics  . Smoking status: Current Some Day Smoker -- 0.50 packs/day  . Smokeless tobacco: None  . Alcohol Use: 0.0 oz/week    0 Standard drinks or equivalent per week     Comment: 1-2 times per month   OB History    No data available     Review of Systems  Musculoskeletal: Positive for back pain.  All other systems reviewed and are negative.     Allergies  Lyrica  Home Medications   Prior to Admission medications   Medication Sig Start Date End Date Taking? Authorizing Provider  ALPRAZolam Duanne Moron) 0.5 MG tablet Take 2 tablets by mouth at bedtime. 02/20/16  Yes Historical Provider, MD  Multiple Vitamins-Minerals (HM MULTIVITAMIN ADULT GUMMY PO) Take 1 tablet by mouth  daily.   Yes Historical Provider, MD  Oxycodone HCl 10 MG TABS Take 1 tablet by mouth 3 (three) times daily as needed (for pain). Reported on 02/21/2016 01/28/16  Yes Historical Provider, MD  phentermine (ADIPEX-P) 37.5 MG tablet Take 1 tablet by mouth daily. 02/12/16  Yes Historical Provider, MD  DULoxetine (CYMBALTA) 60 MG capsule Take 1 capsule (60 mg total) by mouth daily. Patient not taking: Reported on 02/21/2016 12/05/15   Marcial Pacas, MD  methocarbamol (ROBAXIN) 500 MG tablet Take 1 tablet (500 mg total) by mouth 2 (two) times daily. 02/21/16   Dorie Rank, MD  naproxen (NAPROSYN) 500 MG tablet Take 1 tablet (500 mg total) by mouth 2 (two) times daily with a meal. As needed for pain 02/21/16   Dorie Rank, MD  rOPINIRole (REQUIP) 0.5 MG tablet Take 2 tablets (1 mg total) by mouth at bedtime. Patient not taking: Reported on 02/21/2016 12/05/15   Marcial Pacas, MD   BP 111/78 mmHg  Pulse 95  Temp(Src) 98.1 F (36.7 C) (Oral)  Resp 16  Wt 73.936 kg  SpO2 100% Physical Exam  Constitutional: She appears well-developed and well-nourished. No distress.  HENT:  Head: Normocephalic and atraumatic.  Right Ear: External ear normal.  Left Ear: External ear normal.  Eyes: Conjunctivae are normal. Right eye exhibits no discharge. Left eye exhibits no discharge. No scleral icterus.  Neck: Neck supple. No tracheal deviation present.  Cardiovascular: Normal rate.   Pulmonary/Chest: Effort normal. No stridor. No respiratory distress.  Abdominal: She exhibits no mass. There is no tenderness. There is no rebound and no guarding.  Genitourinary: Rectal exam shows no external hemorrhoid, no mass and no tenderness.  Musculoskeletal: She exhibits no edema.       Lumbar back: She exhibits tenderness and bony tenderness. She exhibits no swelling, no edema and no deformity.  Neurological: She is alert. Cranial nerve deficit: no gross deficits.  Skin: Skin is warm and dry. No rash noted.  Psychiatric: She has a normal mood  and affect.  Nursing note and vitals reviewed.   ED Course  Procedures (including critical care time) Labs Review Labs Reviewed  URINALYSIS, ROUTINE W REFLEX MICROSCOPIC (NOT AT [facility]) - Abnormal; Notable for the following:    Hgb urine dipstick TRACE (*)    All other components within normal limits  URINE MICROSCOPIC-ADD ON - Abnormal; Notable for the following:    Squamous Epithelial / LPF 0-5 (*)    Bacteria, UA RARE (*)    All other components within normal limits    Imaging Review Dg Lumbar Spine Complete  02/21/2016  CLINICAL DATA:  Low back pain, bilateral leg pain for 2 weeks, no known injury EXAM: LUMBAR SPINE - COMPLETE 4+ VIEW COMPARISON:  None. FINDINGS: Five views of the lumbar spine submitted. No acute fracture or subluxation. Alignment, disc spaces and vertebral body heights are preserved. Mild disc space flattening at L5-S1 level. IMPRESSION: No acute fracture or subluxation. Mild disc space flattening at L5-S1 level. Electronically Signed   By: Lahoma Crocker M.D.   On: 02/21/2016 17:07   Dg Sacrum/coccyx  02/21/2016  CLINICAL DATA:  45 year old female with lumbar back pain radiating to both lower extremities, increased for 2 weeks with no new injury. Initial encounter. EXAM: SACRUM AND COCCYX - 2+ VIEW COMPARISON:  Lumbar radiographs from today reported separately. FINDINGS: Normal SI joints. Sacral ala appear intact. Central sacral segments appear intact. Coccygeal segments appear within normal limits. Multiple calcifications in the pelvis likely are phleboliths. IMPRESSION: No acute osseous abnormality identified about the sacrum or coccyx. Electronically Signed   By: Genevie Ann M.D.   On: 02/21/2016 17:08   I have personally reviewed and evaluated these images and lab results as part of my medical decision-making.    MDM   Final diagnoses:  Midline low back pain without sciatica    X-rays do not show any acute abnormalities. Patient has tenderness to palpation  lumbosacral region. She has no rectal pain or tenderness. No cyst or abscess appreciated. I do not think her discomfort with bowel movements is associated with any rectal discomfort. I think it's related to the fact that she has to sit down.  Will dc home with nsaids.  Follow up with PCP    Dorie Rank, MD 02/21/16 [id number]

## 2016-02-21 NOTE — Discharge Instructions (Signed)

## 2016-02-21 NOTE — ED Notes (Addendum)
PT c/o lower back pain that radiates down both legs worsening x2 weeks with no new injury. PT ambulatory in triage with NAD noted. PT states worsening in back with bowel movements and states had normal BM this morning and takes stool softeners. PT states server muscle spasms and pain shooting from epidural area.

## 2016-04-03 ENCOUNTER — Encounter: Payer: Self-pay | Admitting: Neurology

## 2016-04-03 ENCOUNTER — Ambulatory Visit (INDEPENDENT_AMBULATORY_CARE_PROVIDER_SITE_OTHER): Payer: 59 | Admitting: Neurology

## 2016-04-03 VITALS — BP 120/74 | HR 73 | Ht 63.0 in | Wt 160.8 lb

## 2016-04-03 DIAGNOSIS — R55 Syncope and collapse: Secondary | ICD-10-CM | POA: Diagnosis not present

## 2016-04-03 DIAGNOSIS — M79606 Pain in leg, unspecified: Secondary | ICD-10-CM

## 2016-04-03 DIAGNOSIS — M898X9 Other specified disorders of bone, unspecified site: Secondary | ICD-10-CM

## 2016-04-03 DIAGNOSIS — M899 Disorder of bone, unspecified: Secondary | ICD-10-CM | POA: Diagnosis not present

## 2016-04-03 MED ORDER — TIZANIDINE HCL 2 MG PO CAPS: 2.0000 mg | ORAL_CAPSULE | Freq: Three times a day (TID) | ORAL | Status: DC

## 2016-04-03 NOTE — Progress Notes (Signed)
Chief Complaint  Patient presents with  . Back Pain    She has noticed worsening back pain and spasms. Pain is radiating into both legs.  She was treated in ED on 02/21/16.  She has also seen Dr. Cay Schillings at [facility] recently and he has gave her a steroid injection for pain.  He also gave oral Prednisone but she only took the medication for one day due to intolerable side effects.      PATIENT: Erica Simmons DOB: 1971-08-21  Chief Complaint  Patient presents with  . Back Pain    She has noticed worsening back pain and spasms. Pain is radiating into both legs.  She was treated in ED on 02/21/16.  She has also seen Dr. Cay Schillings at [facility] recently and he has gave her a steroid injection for pain.  He also gave oral Prednisone but she only took the medication for one day due to intolerable side effects.     HISTORICAL  Erica Simmons is a 45 years old right-handed female, seen in refer by her primary care physician Dr. Wende Neighbors for evaluation of body achy pain, loss of consciousness episodes  in December 05 2015  She reported long-standing history of restless leg symptoms, she often feel numbness tingling urge to move when she sits for prolonged period of time, or lying at bed trying to go to sleep, she has been taking Percocet as needed for symptoms control, but overall she is very active, there was no significant limitation in her daily activity until September 2016, her lower extremity pain has significantly changed, she felt deep achy pain in her bones," as if termites are in my bones", sometimes she woke up in the middle of the night broke into sweat because the severity of the pain, difficulty getting out of the bed, putting weight on her feet in the morning times because of bilateral feet pain, also complains similar but lesser degree of deep muscle bone pain at bilateral upper extremity,  But she gets going, she is active all day long, worried about if she  sits down, the symptoms would recur, she feel tired, exhausted,   She denies significant muscle weakness, she has tried Lyrica in the past, complained of blurry vision, ibuprofen significant GI side effect, tramadol, causing bad headaches,  She reported similar symptoms in her grandmother, and her daughter, I reviewed laboratory evaluation in February 2017, mild abnormal liver functional test otherwise normal CMP, CBC, TSH, negative HIV, ESR, C-reactive protein, CPK  UPDATE June 29th 2017: She tried cymbalta 67m for one month, then she was given wellbutrin, she felt she get mad easily, upset, Requip 0.528m2 tabs qhs, did not help,   Xanax 27m36mvery night was helpful  She now complains of low back pain, back, ankle pain, if she gets up moving she is ok, when she first stand up, she feel like her feet are broken, like she has claws at her feet, she has difficulty walking, she has to stand there for second before moving, it can happen anytime of a day,   She feel like her feet does not move with her body, she still have the pain at tailbone, she sits on the donut pillow, she has mild difficulty with bowel movement.  I reviewed MRI lumbar result on March 22 2016[facility] facet degenerative disc disease right greater than left at L4-5 and L5-S1, no disc pathology or stenosis I personally reviewed x-ray of lumbar in May  2017, no significant pathology  She was given lumbar epidural injection on June 22nd 2017, with out significant benefit. EMG nerve conduction study in January 01 2016 was normal, there was no evidence of bilateral lumbar sacral radiculopathy  I reviewed laboratory evaluations, normal B12, vitamin D, iron level,  REVIEW OF SYSTEMS: Full 14 system review of systems performed and notable only for activity change, fatigue, excessive sweat, runny nose, blurred vision, rectal pain, restless leg, insomnia, frequent awakening, daytime  sleepiness  ALLERGIES: Allergies  Allergen Reactions  . Lyrica [Pregabalin]     Mood swings    HOME MEDICATIONS: Current Outpatient Prescriptions  Medication Sig Dispense Refill  . ALPRAZolam (XANAX) 0.5 MG tablet Take 2 tablets by mouth at bedtime.    . DULoxetine (CYMBALTA) 60 MG capsule Take 1 capsule (60 mg total) by mouth daily. (Patient not taking: Reported on 02/21/2016) 30 capsule 11  . methocarbamol (ROBAXIN) 500 MG tablet Take 1 tablet (500 mg total) by mouth 2 (two) times daily. 20 tablet 0  . Multiple Vitamins-Minerals (HM MULTIVITAMIN ADULT GUMMY PO) Take 1 tablet by mouth daily.    . naproxen (NAPROSYN) 500 MG tablet Take 1 tablet (500 mg total) by mouth 2 (two) times daily with a meal. As needed for pain 20 tablet 0  . Oxycodone HCl 10 MG TABS Take 1 tablet by mouth 3 (three) times daily as needed (for pain). Reported on 02/21/2016  0  . phentermine (ADIPEX-P) 37.5 MG tablet Take 1 tablet by mouth daily.  0  . rOPINIRole (REQUIP) 0.5 MG tablet Take 2 tablets (1 mg total) by mouth at bedtime. (Patient not taking: Reported on 02/21/2016) 60 tablet 6   No current facility-administered medications for this visit.    PAST MEDICAL HISTORY: Past Medical History  Diagnosis Date  . Prediabetes   . Hyperlipemia   . Fibromyalgia   . Elevated liver enzymes   . Syncope   . Restless leg syndrome     PAST SURGICAL HISTORY: Past Surgical History  Procedure Laterality Date  . Abdominal hysterectomy  2000    Total Hysterectomy for Endometriosis  . Ulnar nerve repair      FAMILY HISTORY: Family History  Problem Relation Age of Onset  . Heart disease Maternal Grandmother   . Osteoporosis Maternal Grandmother   . Liver disease Mother   . Diabetes Mother   . Diabetes Maternal Grandfather   . Heart disease Paternal Grandfather   . Breast cancer Maternal Aunt   . Ovarian cancer Sister   . Osteoporosis Maternal Grandmother     SOCIAL HISTORY:  Social History   Social  History  . Marital Status: Married    Spouse Name: N/A  . Number of Children: 2  . Years of Education: HS   Occupational History  . Homemaker    Social History Main Topics  . Smoking status: Current Some Day Smoker -- 0.50 packs/day  . Smokeless tobacco: Not on file  . Alcohol Use: 0.0 oz/week    0 Standard drinks or equivalent per week     Comment: 1-2 times per month  . Drug Use: No  . Sexual Activity: Not on file   Other Topics Concern  . Not on file   Social History Narrative   Lives at home with husband and daughter.   Right-handed.   2 cups caffeine daily.     PHYSICAL EXAM   Filed Vitals:   04/03/16 0733  BP: 120/74  Pulse: 73  Height: _0  (  1.6 m)  Weight: 160 lb 12 oz (72.916 kg)    Not recorded      Body mass index is 28.48 kg/(m^2).  PHYSICAL EXAMNIATION:  Gen: NAD, conversant, well nourised, obese, well groomed                     Cardiovascular: Regular rate rhythm, no peripheral edema, warm, nontender. Eyes: Conjunctivae clear without exudates or hemorrhage Neck: Supple, no carotid bruise. Pulmonary: Clear to auscultation bilaterally   NEUROLOGICAL EXAM:  MENTAL STATUS: Speech:    Speech is normal; fluent and spontaneous with normal comprehension.  Cognition:     Orientation to time, place and person     Normal recent and remote memory     Normal Attention span and concentration     Normal Language, naming, repeating,spontaneous speech     Fund of knowledge   CRANIAL NERVES: CN II: Visual fields are full to confrontation. Fundoscopic exam is normal with sharp discs and no vascular changes. Pupils are round equal and briskly reactive to light. CN III, IV, VI: extraocular movement are normal. No ptosis. CN V: Facial sensation is intact to pinprick in all 3 divisions bilaterally. Corneal responses are intact.  CN VII: Face is symmetric with normal eye closure and smile. CN VIII: Hearing is normal to rubbing fingers CN IX, X: Palate  elevates symmetrically. Phonation is normal. CN XI: Head turning and shoulder shrug are intact CN XII: Tongue is midline with normal movements and no atrophy.  MOTOR: There is no pronator drift of out-stretched arms. Muscle bulk and tone are normal. Muscle strength is normal.  REFLEXES: Reflexes are 2+ and symmetric at the biceps, triceps, knees, and ankles. Plantar responses are flexor.  SENSORY: Intact to light touch, pinprick, position sense, and vibration sense are intact in fingers and toes.  COORDINATION: Rapid alternating movements and fine finger movements are intact. There is no dysmetria on finger-to-nose and heel-knee-shin.    GAIT/STANCE: Posture is normal. Gait is steady with normal steps, base, arm swing, and turning. Heel and toe walking are normal. Tandem gait is normal.  Romberg is absent.   DIAGNOSTIC DATA (LABS, IMAGING, TESTING) - I reviewed patient records, labs, notes, testing and imaging myself where available.   ASSESSMENT AND PLAN  Erica Simmons is a 45 y.o. female    Low back pain  MRI of lumbar, x-ray of lumbar showed no structural relation  Normal EMG nerve conduction study  Extensive laboratory evaluation failed to demonstrate etiology  She has tried and failed Requip, Wellbutrin, Cymbalta, epidural injection  I will refer her to physical therapy, tizanidine 2 mg as needed  No for the neurological follow-up as indicated   Marcial Pacas, M.D. Ph.D.  [facility] [address] [place], [place] [address] Ph: [phone] Fax: [phone]  CC: Celene Squibb, MD

## 2016-04-20 ENCOUNTER — Emergency Department (HOSPITAL_COMMUNITY): Payer: 59

## 2016-04-20 ENCOUNTER — Emergency Department (HOSPITAL_COMMUNITY)
Admission: EM | Admit: 2016-04-20 | Discharge: 2016-04-20 | Disposition: A | Discharge status: Home / Self Care | Payer: 59 | Attending: Emergency Medicine | Admitting: Emergency Medicine

## 2016-04-20 ENCOUNTER — Encounter (HOSPITAL_COMMUNITY): Payer: Self-pay

## 2016-04-20 DIAGNOSIS — M546 Pain in thoracic spine: Secondary | ICD-10-CM | POA: Diagnosis not present

## 2016-04-20 DIAGNOSIS — F17200 Nicotine dependence, unspecified, uncomplicated: Secondary | ICD-10-CM | POA: Insufficient documentation

## 2016-04-20 DIAGNOSIS — Z79899 Other long term (current) drug therapy: Secondary | ICD-10-CM | POA: Diagnosis not present

## 2016-04-20 DIAGNOSIS — R1011 Right upper quadrant pain: Secondary | ICD-10-CM | POA: Insufficient documentation

## 2016-04-20 LAB — COMPREHENSIVE METABOLIC PANEL
ALBUMIN: 4.1 g/dL (ref 3.5–5.0)
ALK PHOS: 86 U/L (ref 38–126)
ALT: 22 U/L (ref 14–54)
AST: 23 U/L (ref 15–41)
Anion gap: 9 (ref 5–15)
BILIRUBIN TOTAL: 0.7 mg/dL (ref 0.3–1.2)
BUN: 17 mg/dL (ref 6–20)
CO2: 23 mmol/L (ref 22–32)
Calcium: 9.6 mg/dL (ref 8.9–10.3)
Chloride: 107 mmol/L (ref 101–111)
Creatinine, Ser: 0.77 mg/dL (ref 0.44–1.00)
GFR calc Af Amer: >60 mL/min (ref >60)
Glucose, Bld: 93 mg/dL (ref 65–99)
POTASSIUM: 3.9 mmol/L (ref 3.5–5.1)
Sodium: 139 mmol/L (ref 135–145)
Total Protein: 7.3 g/dL (ref 6.5–8.1)

## 2016-04-20 LAB — CBC
HCT: 43.6 % (ref 36.0–46.0)
HEMOGLOBIN: 14.5 g/dL (ref 12.0–15.0)
MCH: 31.7 pg (ref 26.0–34.0)
MCHC: 33.3 g/dL (ref 30.0–36.0)
MCV: 95.4 fL (ref 78.0–100.0)
Platelets: 360 10*3/uL (ref 150–400)
RBC: 4.57 MIL/uL (ref 3.87–5.11)
RDW: 15 % (ref 11.5–15.5)
WBC: 10.5 10*3/uL (ref 4.0–10.5)

## 2016-04-20 LAB — URINE MICROSCOPIC-ADD ON: BACTERIA UA: NONE SEEN

## 2016-04-20 LAB — URINALYSIS, ROUTINE W REFLEX MICROSCOPIC
Bilirubin Urine: NEGATIVE
GLUCOSE, UA: NEGATIVE mg/dL
Ketones, ur: NEGATIVE mg/dL
Leukocytes, UA: NEGATIVE
Nitrite: NEGATIVE
PH: 6 (ref 5.0–8.0)
Protein, ur: NEGATIVE mg/dL
SPECIFIC GRAVITY, URINE: 1.026 (ref 1.005–1.030)

## 2016-04-20 LAB — LIPASE, BLOOD: Lipase: 22 U/L (ref 11–51)

## 2016-04-20 LAB — POC OCCULT BLOOD, ED: FECAL OCCULT BLD: NEGATIVE

## 2016-04-20 MED ORDER — GI COCKTAIL ~~LOC~~
30.0000 mL | Freq: Once | ORAL | Status: AC
  Administered 2016-04-20: 30 mL via ORAL
  Filled 2016-04-20: qty 30

## 2016-04-20 MED ORDER — OMEPRAZOLE 20 MG PO CPDR: 20.0000 mg | DELAYED_RELEASE_CAPSULE | Freq: Every day | ORAL | Status: DC

## 2016-04-20 MED ORDER — ONDANSETRON HCL 4 MG/2ML IJ SOLN
4.0000 mg | Freq: Once | INTRAMUSCULAR | Status: AC
  Administered 2016-04-20: 4 mg via INTRAVENOUS
  Filled 2016-04-20: qty 2

## 2016-04-20 MED ORDER — ONDANSETRON HCL 4 MG PO TABS: 4.0000 mg | ORAL_TABLET | Freq: Three times a day (TID) | ORAL | Status: DC | PRN

## 2016-04-20 MED ORDER — MORPHINE SULFATE (PF) 4 MG/ML IV SOLN
4.0000 mg | Freq: Once | INTRAVENOUS | Status: AC
Start: 2016-04-20 — End: 2016-04-20
  Administered 2016-04-20: 4 mg via INTRAVENOUS
  Filled 2016-04-20: qty 1

## 2016-04-20 MED ORDER — OXYCODONE-ACETAMINOPHEN 5-325 MG PO TABS: 2.0000 | ORAL_TABLET | ORAL | Status: DC | PRN

## 2016-04-20 MED ORDER — OXYCODONE-ACETAMINOPHEN 5-325 MG PO TABS
1.0000 | ORAL_TABLET | Freq: Once | ORAL | Status: AC
  Administered 2016-04-20: 1 via ORAL
  Filled 2016-04-20: qty 1

## 2016-04-20 MED ORDER — SODIUM CHLORIDE 0.9 % IV BOLUS (SEPSIS)
1000.0000 mL | Freq: Once | INTRAVENOUS | Status: AC
Start: 2016-04-20 — End: 2016-04-20
  Administered 2016-04-20: 1000 mL via INTRAVENOUS

## 2016-04-20 MED ORDER — SUCRALFATE 1 G PO TABS: 1.0000 g | ORAL_TABLET | Freq: Three times a day (TID) | ORAL | Status: DC

## 2016-04-20 MED ORDER — MORPHINE SULFATE (PF) 4 MG/ML IV SOLN
4.0000 mg | Freq: Once | INTRAVENOUS | Status: AC
  Administered 2016-04-20: 4 mg via INTRAVENOUS
  Filled 2016-04-20: qty 1

## 2016-04-20 NOTE — ED Notes (Signed)
Patient here with epigastric pain since last night. Pain worse during the night with vomiting today. States that she doesn't feel well. Has a lot of family stress and reports taking additional ibuprofen daily. Dark stools for same. States pain radiates to back

## 2016-04-20 NOTE — ED Notes (Signed)
MD at bedside. 

## 2016-04-20 NOTE — ED Notes (Addendum)
Patient has had hysterectomy

## 2016-04-20 NOTE — ED Provider Notes (Signed)
Emergency Department Provider Note  Time seen: Approximately 10:58 AM  I have reviewed the triage vital signs and the nursing notes.   HISTORY  Chief Complaint Abdominal Pain   HPI Erica Simmons is a 45 y.o. female with PMH of mild reflux and chronic neck pain presents to the emergency department for evaluation of severe epigastric pain starting last night. The patient states that pain began without obvious provocation. She reports epigastric to slight right upper quadrant pain. She states it feels it radiates to her back. No lower abdominal pain. Patient has a prior surgical history significant for hysterectomy and appendectomy. She has had associated watery diarrhea that she describes as black. No gross blood. No associated vomiting but she does feel nauseated. She has no chest pain or difficulty breathing. She reports taking frequent ibuprofen for her chronic neck pain. She does not take regular as reflux medications. She has tried Alka-Seltzer at home with no relief.   Past Medical History  Diagnosis Date  . Prediabetes   . Hyperlipemia   . Fibromyalgia   . Elevated liver enzymes   . Syncope   . Restless leg syndrome     Patient Active Problem List   Diagnosis Date Noted  . Restless leg syndrome 12/05/2015  . Pain in limb 12/05/2015  . Faintness   . Syncopal episodes 11/10/2015  . Tobacco abuse 11/10/2015  . Elevated liver enzymes 11/10/2015  . Bone pain 11/10/2015    Past Surgical History  Procedure Laterality Date  . Abdominal hysterectomy  2000    Total Hysterectomy for Endometriosis  . Ulnar nerve repair      Current Outpatient Rx  Name  Route  Sig  Dispense  Refill  . buPROPion (WELLBUTRIN XL) 150 MG 24 hr tablet      TK 1 T PO D      5   . Multiple Vitamins-Minerals (HM MULTIVITAMIN ADULT GUMMY PO)   Oral   Take 1 tablet by mouth daily.         . Oxycodone HCl 10 MG TABS   Oral   Take 1 tablet by mouth 3 (three) times daily as needed (for  pain). Reported on 02/21/2016      0   . phentermine (ADIPEX-P) 37.5 MG tablet   Oral   Take 1 tablet by mouth daily.      0   . tizanidine (ZANAFLEX) 2 MG capsule   Oral   Take 1 capsule (2 mg total) by mouth 3 (three) times daily.   90 capsule   6     Allergies Lyrica  Family History  Problem Relation Age of Onset  . Heart disease Maternal Grandmother   . Osteoporosis Maternal Grandmother   . Liver disease Mother   . Diabetes Mother   . Diabetes Maternal Grandfather   . Heart disease Paternal Grandfather   . Breast cancer Maternal Aunt   . Ovarian cancer Sister   . Osteoporosis Maternal Grandmother     Social History Social History  Substance Use Topics  . Smoking status: Current Some Day Smoker -- 0.50 packs/day  . Smokeless tobacco: None  . Alcohol Use: 0.0 oz/week    0 Standard drinks or equivalent per week     Comment: 1-2 times per month    Review of Systems  Constitutional: No fever/chills Eyes: No visual changes. ENT: No sore throat. Cardiovascular: Denies chest pain. Respiratory: Denies shortness of breath. Gastrointestinal: Positive epigastric abdominal pain.  Positive nausea, no vomiting.  Positive diarrhea.  No constipation. Genitourinary: Negative for dysuria. Musculoskeletal: Positive for back pain. Skin: Negative for rash. Neurological: Negative for headaches, focal weakness or numbness.  10-point ROS otherwise negative.  ____________________________________________   PHYSICAL EXAM:  VITAL SIGNS: ED Triage Vitals  Enc Vitals Group     BP 04/20/16 1021 145/105 mmHg     Pulse Rate 04/20/16 1021 106     Resp 04/20/16 1021 16     Temp 04/20/16 1021 98.1 F (36.7 C)     Temp Source 04/20/16 1021 Oral     SpO2 04/20/16 1021 99 %     Weight 04/20/16 1021 159 lb (72.122 kg)     Height 04/20/16 1021 5\' 3"  (1.6 m)     Pain Score 04/20/16 1021 7   Constitutional: Alert and oriented. Well appearing and in no acute distress. Eyes:  Conjunctivae are normal. PERRL. EOMI. Head: Atraumatic. Nose: No congestion/rhinnorhea. Mouth/Throat: Mucous membranes are moist.  Oropharynx non-erythematous. Neck: No stridor.   Cardiovascular: Tachycardia. Good peripheral circulation. Grossly normal heart sounds.   Respiratory: Normal respiratory effort.  No retractions. Lungs CTAB. Gastrointestinal: Soft with very mild epigastric tenderness. No significant RUQ tenderness. Negative Murphy's sign. No gross blood on rectal exam with brown stool.  Musculoskeletal: No lower extremity tenderness nor edema. No gross deformities of extremities. Neurologic:  Normal speech and language. No gross focal neurologic deficits are appreciated.  Skin:  Skin is warm, dry and intact. No rash noted. Psychiatric: Mood and affect are normal. Speech and behavior are normal.  ____________________________________________   LABS (all labs ordered are listed, but only abnormal results are displayed)  Labs Reviewed  URINALYSIS, ROUTINE W REFLEX MICROSCOPIC (NOT AT [facility]) - Abnormal; Notable for the following:    Hgb urine dipstick TRACE (*)    All other components within normal limits  URINE MICROSCOPIC-ADD ON - Abnormal; Notable for the following:    Squamous Epithelial / LPF 0-5 (*)    All other components within normal limits  LIPASE, BLOOD  COMPREHENSIVE METABOLIC PANEL  CBC  POC OCCULT BLOOD, ED   ____________________________________________  RADIOLOGY  Ct Renal Stone Study  04/20/2016  CLINICAL DATA:  Right flank pain and back pain. Urine is the color of tea. History of hysterectomy, appendectomy. EXAM: CT ABDOMEN AND PELVIS WITHOUT CONTRAST TECHNIQUE: Multidetector CT imaging of the abdomen and pelvis was performed following the standard protocol without IV contrast. COMPARISON:  None applicable FINDINGS: Lower chest: There is minimal right lower lobe atelectasis. Heart size is normal. No imaged pericardial effusion or significant coronary artery  calcifications. Hepatobiliary: Liver is homogeneous.  Gallbladder is present. Pancreas: Unremarkable. Spleen: Unremarkable.  Left upper quadrant splenule. Renal/Adrenal: Adrenal glands are normal. Kidneys are symmetric in size. No intrarenal mass, intrarenal stones, or hydronephrosis. The ureters are normal in appearance. Gastrointestinal tract: Stomach is normal in appearance. The appendix is surgically absent. Colonic loops are normal in appearance. Moderate stool within the right hemicolon. Reproductive/Pelvis: Status post hysterectomy. No adnexal mass. No free pelvic fluid. Small amount of gas is identified within the urinary bladder. Vascular/Lymphatic: No retroperitoneal or mesenteric adenopathy. No evidence for aortic aneurysm. Musculoskeletal/Abdominal wall: Unremarkable abdominal wall. Visualized osseous structures have a normal appearance. Other: none IMPRESSION: 1. Small amount of air within the bladder. Has the patient had recent bladder catheterization? Otherwise findings could be related to cystitis. 2. No other evidence for acute abdominal or pelvic abnormality. Electronically Signed   By: Nolon Nations M.D.   On: 04/20/2016 14:50  US Abdomen Limited Ruq  04/20/2016  CLINICAL DATA:  Right upper quadrant pain EXAM: US ABDOMEN LIMITED - RIGHT UPPER QUADRANT COMPARISON:  None. FINDINGS: Gallbladder: No gallstones or wall thickening visualized. No sonographic Murphy sign noted by sonographer. Common bile duct: Diameter: 4 mm Liver: No focal lesion identified. Within normal limits in parenchymal echogenicity. IMPRESSION: 1. Normal sonogram. Electronically Signed   By: Kerby Moors M.D.   On: 04/20/2016 12:03    ____________________________________________   PROCEDURES  Procedure(s) performed:   Procedures  None ____________________________________________   INITIAL IMPRESSION / ASSESSMENT AND PLAN / ED COURSE  Pertinent labs & imaging results that were available during my care of  the patient were reviewed by me and considered in my medical decision making (see chart for details).  Patient resents to the emergency department for evaluation of persistent epigastric pain in the setting of ongoing reflux disease, indigestion, and associated dark diarrhea. The patient has increased risk for developing ulcers and gastritis due to chronic NSAID use for associated orthopedic pain. Patient's abdomen is soft and only mildly tender to palpation in epigastric region. The right upper quadrant is nontender to palpation. Patient is overall well-appearing. The patient has a prior appendectomy and hysterectomy. No evidence to suggest UTI. Considering enteritis, gastritis, peptic ulcer disease, cholecystitis. With relatively unremarkable right upper quadrant exam will hold on ultrasound of the gallbladder at this time pending labs. Will treat pain, nausea, and give IVF.   02:01 PM Patient now with pain more focused in the right side of her back. No focal tenderness to palpation. We will obtain CT scan of the abdomen and pelvis without contrast to evaluate for possible kidney stone.   CT negative for stone or other obvious acute abdominal pathology. Suspect gastritis vs PUD. No clinical evidence to suggest perforated ulcer. No evidence to suggest acute spine or aortic etiology of pain although these were considered. Plan for symptomatic treatment of pain and starting Omeprazole.   ____________________________________________  FINAL CLINICAL IMPRESSION(S) / ED DIAGNOSES  Final diagnoses:  RUQ abdominal pain  Right-sided thoracic back pain     MEDICATIONS GIVEN DURING THIS VISIT:  Medications  sodium chloride 0.9 % bolus 1,000 mL (0 mLs Intravenous Stopped 04/20/16 1244)  morphine 4 MG/ML injection 4 mg (4 mg Intravenous Given 04/20/16 1113)  ondansetron (ZOFRAN) injection 4 mg (4 mg Intravenous Given 04/20/16 1113)  gi cocktail (Maalox,Lidocaine,Donnatal) (30 mLs Oral Given 04/20/16 1149)    morphine 4 MG/ML injection 4 mg (4 mg Intravenous Given 04/20/16 1249)  oxyCODONE-acetaminophen (PERCOCET/ROXICET) 5-325 MG per tablet 1 tablet (1 tablet Oral Given 04/20/16 1501)     NEW OUTPATIENT MEDICATIONS STARTED DURING THIS VISIT:  Discharge Medication List as of 04/20/2016  3:02 PM    START taking these medications   Details  omeprazole (PRILOSEC) 20 MG capsule Take 1 capsule (20 mg total) by mouth daily., Starting 04/20/2016, Until Discontinued, Print    ondansetron (ZOFRAN) 4 MG tablet Take 1 tablet (4 mg total) by mouth every 8 (eight) hours as needed for nausea or vomiting., Starting 04/20/2016, Until Discontinued, Print    oxyCODONE-acetaminophen (PERCOCET/ROXICET) 5-325 MG tablet Take 2 tablets by mouth every 4 (four) hours as needed for severe pain., Starting 04/20/2016, Until Discontinued, Print    sucralfate (CARAFATE) 1 g tablet Take 1 tablet (1 g total) by mouth 4 (four) times daily -  with meals and at bedtime., Starting 04/20/2016, Until Discontinued, Print          Note:  This  document was prepared using Systems analyst and may include unintentional dictation errors.  Nanda Quinton, MD Emergency Medicine  Margette Fast, MD 04/20/16 [phone]

## 2016-04-20 NOTE — Discharge Instructions (Signed)
You have been seen in the Emergency Department (ED) for abdominal pain.  Your evaluation did not identify a clear cause of your symptoms but was generally reassuring. Abdominal Pain, Adult Many things can cause abdominal pain. Usually, abdominal pain is not caused by a disease and will improve without treatment. It can often be observed and treated at home. Your health care provider will do a physical exam and possibly order blood tests and X-rays to help determine the seriousness of your pain. However, in many cases, more time must pass before a clear cause of the pain can be found. Before that point, your health care provider may not know if you need more testing or further treatment. HOME CARE INSTRUCTIONS Monitor your abdominal pain for any changes. The following actions may help to alleviate any discomfort you are experiencing:  Only take over-the-counter or prescription medicines as directed by your health care provider.  Do not take laxatives unless directed to do so by your health care provider.  Try a clear liquid diet (broth, tea, or water) as directed by your health care provider. Slowly move to a bland diet as tolerated. SEEK MEDICAL CARE IF:  You have unexplained abdominal pain.  You have abdominal pain associated with nausea or diarrhea.  You have pain when you urinate or have a bowel movement.  You experience abdominal pain that wakes you in the night.  You have abdominal pain that is worsened or improved by eating food.  You have abdominal pain that is worsened with eating fatty foods.  You have a fever. SEEK IMMEDIATE MEDICAL CARE IF:  Your pain does not go away within 2 hours.  You keep throwing up (vomiting).  Your pain is felt only in portions of the abdomen, such as the right side or the left lower portion of the abdomen.  You pass bloody or black tarry stools. MAKE SURE YOU:  Understand these instructions.  Will watch your condition.  Will get help right  away if you are not doing well or get worse.   This information is not intended to replace advice given to you by your health care provider. Make sure you discuss any questions you have with your health care provider.   Document Released: 07/02/2005 Document Revised: 06/13/2015 Document Reviewed: 06/01/2013 Elsevier Interactive Patient Education [employer].   Please follow up as instructed above regarding todays emergent visit and the symptoms that are bothering you.  Return to the ED if your abdominal pain worsens or fails to improve, you develop bloody vomiting, bloody diarrhea, you are unable to tolerate fluids due to vomiting, fever greater than 101, or other symptoms that concern you.

## 2016-05-05 ENCOUNTER — Other Ambulatory Visit: Payer: Self-pay | Admitting: Orthopedic Surgery

## 2016-05-05 DIAGNOSIS — M542 Cervicalgia: Secondary | ICD-10-CM

## 2016-05-15 ENCOUNTER — Ambulatory Visit
Admission: RE | Admit: 2016-05-15 | Discharge: 2016-05-15 | Disposition: A | Discharge status: Home / Self Care | Payer: 59 | Source: Ambulatory Visit | Attending: Orthopedic Surgery | Admitting: Orthopedic Surgery

## 2016-05-15 DIAGNOSIS — M542 Cervicalgia: Secondary | ICD-10-CM

## 2016-05-15 MED ORDER — GADOBENATE DIMEGLUMINE 529 MG/ML IV SOLN
15.0000 mL | Freq: Once | INTRAVENOUS | Status: AC | PRN
  Administered 2016-05-15: 15 mL via INTRAVENOUS

## 2016-05-16 ENCOUNTER — Encounter: Payer: Self-pay | Admitting: Neurology

## 2016-05-21 ENCOUNTER — Ambulatory Visit: Payer: 59 | Admitting: Neurology

## 2016-05-30 ENCOUNTER — Emergency Department (HOSPITAL_COMMUNITY)
Admission: EM | Admit: 2016-05-30 | Discharge: 2016-05-30 | Disposition: A | Discharge status: Home / Self Care | Payer: 59 | Attending: Emergency Medicine | Admitting: Emergency Medicine

## 2016-05-30 ENCOUNTER — Encounter (HOSPITAL_COMMUNITY): Payer: Self-pay

## 2016-05-30 DIAGNOSIS — Z5321 Procedure and treatment not carried out due to patient leaving prior to being seen by health care provider: Secondary | ICD-10-CM | POA: Insufficient documentation

## 2016-05-30 DIAGNOSIS — F17210 Nicotine dependence, cigarettes, uncomplicated: Secondary | ICD-10-CM | POA: Insufficient documentation

## 2016-05-30 DIAGNOSIS — G43909 Migraine, unspecified, not intractable, without status migrainosus: Secondary | ICD-10-CM | POA: Insufficient documentation

## 2016-05-30 DIAGNOSIS — H538 Other visual disturbances: Secondary | ICD-10-CM | POA: Diagnosis not present

## 2016-05-30 MED ORDER — OXYCODONE-ACETAMINOPHEN 5-325 MG PO TABS
ORAL_TABLET | ORAL | Status: AC
  Filled 2016-05-30: qty 1

## 2016-05-30 MED ORDER — ONDANSETRON 4 MG PO TBDP
ORAL_TABLET | ORAL | Status: AC
  Filled 2016-05-30: qty 1

## 2016-05-30 MED ORDER — OXYCODONE-ACETAMINOPHEN 5-325 MG PO TABS
1.0000 | ORAL_TABLET | ORAL | Status: DC | PRN
  Administered 2016-05-30: 1 via ORAL

## 2016-05-30 MED ORDER — ONDANSETRON 4 MG PO TBDP
4.0000 mg | ORAL_TABLET | Freq: Once | ORAL | Status: AC | PRN
  Administered 2016-05-30: 4 mg via ORAL

## 2016-05-30 NOTE — ED Triage Notes (Signed)
Pt. Presents for evaluation of symptoms associated with recent dx of pituitary tumor. Pt. States approx 1 week of severe headache, N/V, and tunnel vision. Pt. AxO x4. Pt has been taking zofran and percocet with no improvement. Last pain pill around 0630 this AM. Pt. Has appt 9/12 with endocrinologist.

## 2016-05-30 NOTE — ED Notes (Signed)
Pt reports medication did not help at all. Pt advised to stay and we were doing what we could to get people seen by the MD. Pt verbalized she was going to leave.

## 2016-05-31 ENCOUNTER — Emergency Department (HOSPITAL_COMMUNITY): Payer: 59

## 2016-05-31 ENCOUNTER — Encounter (HOSPITAL_COMMUNITY): Payer: Self-pay | Admitting: Emergency Medicine

## 2016-05-31 ENCOUNTER — Observation Stay (HOSPITAL_COMMUNITY)
Admission: EM | Admit: 2016-05-31 | Discharge: 2016-05-31 | Discharge status: Against Medical Advice | Payer: 59 | Attending: Internal Medicine | Admitting: Internal Medicine

## 2016-05-31 ENCOUNTER — Observation Stay (HOSPITAL_COMMUNITY): Payer: 59

## 2016-05-31 DIAGNOSIS — H547 Unspecified visual loss: Secondary | ICD-10-CM

## 2016-05-31 DIAGNOSIS — N809 Endometriosis, unspecified: Secondary | ICD-10-CM | POA: Diagnosis not present

## 2016-05-31 DIAGNOSIS — M542 Cervicalgia: Secondary | ICD-10-CM | POA: Diagnosis not present

## 2016-05-31 DIAGNOSIS — E8729 Other acidosis: Secondary | ICD-10-CM | POA: Diagnosis present

## 2016-05-31 DIAGNOSIS — M797 Fibromyalgia: Secondary | ICD-10-CM | POA: Diagnosis not present

## 2016-05-31 DIAGNOSIS — R7303 Prediabetes: Secondary | ICD-10-CM | POA: Diagnosis not present

## 2016-05-31 DIAGNOSIS — R0902 Hypoxemia: Secondary | ICD-10-CM | POA: Diagnosis not present

## 2016-05-31 DIAGNOSIS — H538 Other visual disturbances: Secondary | ICD-10-CM | POA: Insufficient documentation

## 2016-05-31 DIAGNOSIS — E872 Acidosis: Secondary | ICD-10-CM | POA: Insufficient documentation

## 2016-05-31 DIAGNOSIS — G2581 Restless legs syndrome: Secondary | ICD-10-CM | POA: Insufficient documentation

## 2016-05-31 DIAGNOSIS — G8929 Other chronic pain: Secondary | ICD-10-CM | POA: Diagnosis not present

## 2016-05-31 DIAGNOSIS — M2550 Pain in unspecified joint: Secondary | ICD-10-CM | POA: Insufficient documentation

## 2016-05-31 DIAGNOSIS — Z5321 Procedure and treatment not carried out due to patient leaving prior to being seen by health care provider: Secondary | ICD-10-CM

## 2016-05-31 DIAGNOSIS — D352 Benign neoplasm of pituitary gland: Secondary | ICD-10-CM | POA: Diagnosis not present

## 2016-05-31 DIAGNOSIS — M549 Dorsalgia, unspecified: Secondary | ICD-10-CM | POA: Insufficient documentation

## 2016-05-31 DIAGNOSIS — R51 Headache: Principal | ICD-10-CM | POA: Insufficient documentation

## 2016-05-31 DIAGNOSIS — F17210 Nicotine dependence, cigarettes, uncomplicated: Secondary | ICD-10-CM | POA: Diagnosis not present

## 2016-05-31 DIAGNOSIS — M6281 Muscle weakness (generalized): Secondary | ICD-10-CM | POA: Diagnosis not present

## 2016-05-31 DIAGNOSIS — E785 Hyperlipidemia, unspecified: Secondary | ICD-10-CM | POA: Diagnosis not present

## 2016-05-31 DIAGNOSIS — R42 Dizziness and giddiness: Secondary | ICD-10-CM | POA: Diagnosis not present

## 2016-05-31 DIAGNOSIS — G43709 Chronic migraine without aura, not intractable, without status migrainosus: Secondary | ICD-10-CM | POA: Diagnosis not present

## 2016-05-31 DIAGNOSIS — F17200 Nicotine dependence, unspecified, uncomplicated: Secondary | ICD-10-CM

## 2016-05-31 DIAGNOSIS — Z90710 Acquired absence of both cervix and uterus: Secondary | ICD-10-CM | POA: Diagnosis not present

## 2016-05-31 LAB — CBC WITH DIFFERENTIAL/PLATELET
Basophils Absolute: 0 10*3/uL (ref 0.0–0.1)
Basophils Relative: 0 %
Eosinophils Absolute: 0.3 10*3/uL (ref 0.0–0.7)
Eosinophils Relative: 3 %
HCT: 41.5 % (ref 36.0–46.0)
Hemoglobin: 13.3 g/dL (ref 12.0–15.0)
Lymphocytes Relative: 35 %
Lymphs Abs: 2.8 10*3/uL (ref 0.7–4.0)
MCH: 31.2 pg (ref 26.0–34.0)
MCHC: 32 g/dL (ref 30.0–36.0)
MCV: 97.4 fL (ref 78.0–100.0)
Monocytes Absolute: 0.3 10*3/uL (ref 0.1–1.0)
Monocytes Relative: 4 %
Neutro Abs: 4.7 10*3/uL (ref 1.7–7.7)
Neutrophils Relative %: 58 %
Platelets: 324 10*3/uL (ref 150–400)
RBC: 4.26 MIL/uL (ref 3.87–5.11)
RDW: 14.4 % (ref 11.5–15.5)
WBC: 8.2 10*3/uL (ref 4.0–10.5)

## 2016-05-31 LAB — COMPREHENSIVE METABOLIC PANEL
ALT: 41 U/L (ref 14–54)
AST: 35 U/L (ref 15–41)
Albumin: 4 g/dL (ref 3.5–5.0)
Alkaline Phosphatase: 62 U/L (ref 38–126)
Anion gap: 7 (ref 5–15)
BUN: 7 mg/dL (ref 6–20)
CHLORIDE: 100 mmol/L — AB (ref 101–111)
CO2: 28 mmol/L (ref 22–32)
CREATININE: 0.84 mg/dL (ref 0.44–1.00)
Calcium: 9.6 mg/dL (ref 8.9–10.3)
GFR calc non Af Amer: >60 mL/min (ref >60)
Glucose, Bld: 104 mg/dL — ABNORMAL HIGH (ref 65–99)
POTASSIUM: 4.1 mmol/L (ref 3.5–5.1)
Sodium: 135 mmol/L (ref 135–145)
Total Bilirubin: 0.5 mg/dL (ref 0.3–1.2)
Total Protein: 6.9 g/dL (ref 6.5–8.1)

## 2016-05-31 LAB — I-STAT ARTERIAL BLOOD GAS, ED
Acid-Base Excess: 6 mmol/L — ABNORMAL HIGH (ref 0.0–2.0)
Acid-Base Excess: 3 mmol/L — ABNORMAL HIGH (ref 0.0–2.0)
BICARBONATE: 29.1 meq/L — AB (ref 20.0–24.0)
BICARBONATE: 30 meq/L — AB (ref 20.0–24.0)
O2 SAT: 77 %
O2 Saturation: 82 %
PCO2 ART: 34.3 mmHg — AB (ref 35.0–45.0)
PCO2 ART: 56.7 mmHg — AB (ref 35.0–45.0)
PH ART: 7.536 — AB (ref 7.350–7.450)
PO2 ART: 36 mmHg — AB (ref 80.0–100.0)
PO2 ART: 51 mmHg — AB (ref 80.0–100.0)
Patient temperature: 98.5
Patient temperature: 98.5
TCO2: 30 mmol/L (ref 0–100)
TCO2: 32 mmol/L (ref 0–100)
pH, Arterial: 7.331 — ABNORMAL LOW (ref 7.350–7.450)

## 2016-05-31 LAB — MRSA PCR SCREENING: MRSA BY PCR: NEGATIVE

## 2016-05-31 LAB — SEDIMENTATION RATE: Sed Rate: 20 mm/hr (ref 0–22)

## 2016-05-31 LAB — TSH: TSH: 1.879 u[IU]/mL (ref 0.350–4.500)

## 2016-05-31 MED ORDER — KETOROLAC TROMETHAMINE 30 MG/ML IJ SOLN
30.0000 mg | Freq: Four times a day (QID) | INTRAMUSCULAR | Status: DC | PRN
  Administered 2016-05-31: 30 mg via INTRAVENOUS
  Filled 2016-05-31: qty 1

## 2016-05-31 MED ORDER — NICOTINE 14 MG/24HR TD PT24
14.0000 mg | MEDICATED_PATCH | TRANSDERMAL | Status: DC
  Administered 2016-05-31: 14 mg via TRANSDERMAL
  Filled 2016-05-31: qty 1

## 2016-05-31 MED ORDER — ENOXAPARIN SODIUM 40 MG/0.4ML ~~LOC~~ SOLN
40.0000 mg | SUBCUTANEOUS | Status: DC
  Administered 2016-05-31: 40 mg via SUBCUTANEOUS
  Filled 2016-05-31: qty 0.4

## 2016-05-31 MED ORDER — GADOBENATE DIMEGLUMINE 529 MG/ML IV SOLN: 10.0000 mL | Freq: Once | INTRAVENOUS | Status: DC | PRN

## 2016-05-31 MED ORDER — POTASSIUM CHLORIDE IN NACL 40-0.9 MEQ/L-% IV SOLN
INTRAVENOUS | Status: DC
  Administered 2016-05-31: 100 mL/h via INTRAVENOUS
  Filled 2016-05-31: qty 1000

## 2016-05-31 MED ORDER — THIAMINE HCL 100 MG/ML IJ SOLN: 100.0000 mg | Freq: Every day | INTRAMUSCULAR | Status: DC

## 2016-05-31 MED ORDER — METOCLOPRAMIDE HCL 5 MG/ML IJ SOLN
10.0000 mg | Freq: Once | INTRAMUSCULAR | Status: AC
  Administered 2016-05-31: 10 mg via INTRAVENOUS
  Filled 2016-05-31: qty 2

## 2016-05-31 NOTE — ED Triage Notes (Addendum)
Pt in from home with c/o migraine x 2wks, hx of recent dx of pituitary tumor. Endorses n/v, dizziness and sensitivity to light. Reporting new extremity swelling. Pt has been taking Percocet and Zofran with no relief. Endocrinologist appt on 9/12 scheduled.

## 2016-05-31 NOTE — Progress Notes (Signed)
ABG collected, resulted. Value shows venous sample. Second RT to attempt to collect. MD aware. RT will continue to monitor.

## 2016-05-31 NOTE — ED Notes (Signed)
Admitting at bedside 

## 2016-05-31 NOTE — ED Notes (Signed)
Pts husband stated that sometimes during the night the pt wakes up during the night and feels like she can't breathe. He also states that when the pt is eating sometimes she "has a hard time swallowing and she gets choked".

## 2016-05-31 NOTE — Progress Notes (Signed)
Patient wants to go home, educated patient on risks of leaving AMA, pt. Verbalized understanding.  States she would be willing to return in am if doctor would discharge her home tonight.  MD advised to remain in hospital, would not discharge tonight, if patient leaves tonight it would be AMA.  Pt. Decided to leave AMA.  AMA paper signed, Doctor notified.  NSL discontinued, patient left floor with husband.

## 2016-05-31 NOTE — Progress Notes (Signed)
Pt arrived to unit lethargic and on 2L [place] sats high 90's. Took Pt off Jessie before MRI but she desatted to 86-87%, placed pt back on 2 L. Pt now on room air and O2 sats are 94%. MD notified about PRN pain medication and nicotine patch. Family states pt wants to go home, MD made aware. 05/31/16  7:01 PM  Damaya Channing Rica Mote, RN

## 2016-05-31 NOTE — Discharge Summary (Signed)
Patient Left Simmons   Name: Erica Simmons MRN: [id number] DOB: Jun 30, 1971 45 y.o. PCP: Celene Squibb, MD  Date of Admission: 05/31/2016  8:25 AM Date of Discharge: 06/02/2016 Attending Physician: No att. providers found  Discharge Diagnosis: 1. Headache   Simmons with history of headaches for the past 2 weeks  Negative CT head MRI was inconclusive due to motion artifact, negative ESR   Neurology was consulted and thought these headaches are rebound in nature  Patient left Simmons before full workup was complete  Active Problems:   Respiratory acidosis   Discharge Medications:   Medication List    ASK your doctor about these medications   ALPRAZolam 0.5 MG tablet Commonly known as:  XANAX Take 0.5 mg by mouth 2 (two) times daily as needed for anxiety.   esomeprazole 40 MG capsule Commonly known as:  NEXIUM Take 40 mg by mouth daily at 12 noon.   HM MULTIVITAMIN ADULT GUMMY PO Take 1 tablet by mouth daily.   omeprazole 20 MG capsule Commonly known as:  PRILOSEC Take 1 capsule (20 mg total) by mouth daily.   ondansetron 4 MG tablet Commonly known as:  ZOFRAN Take 1 tablet (4 mg total) by mouth every 8 (eight) hours as needed for nausea or vomiting.   Oxycodone HCl 10 MG Tabs Take 1 tablet by mouth 3 (three) times daily as needed (for pain). Reported on 02/21/2016   oxyCODONE-acetaminophen 5-325 MG tablet Commonly known as:  PERCOCET/ROXICET Take 2 tablets by mouth every 4 (four) hours as needed for severe pain.   phentermine 37.5 MG tablet Commonly known as:  ADIPEX-P Take 1 tablet by mouth daily.   sucralfate 1 g tablet Commonly known as:  CARAFATE Take 1 tablet (1 g total) by mouth 4 (four) times daily -  with meals and at bedtime.   tizanidine 2 MG capsule Commonly known as:  ZANAFLEX Take 1 capsule (2 mg total) by mouth 3 (three) times daily.       Disposition and follow-up:   Erica Simmons was discharged from [facility] in Good  condition.  At the hospital follow up visit please address:  1.  Headache - Has she had any improvement in these symptoms? Has she decreased the frequency of her alka-seltzer and goody powder use?   2.  Labs / imaging needed at time of follow-up: none   3.  Pending labs/ test needing follow-up: none   Follow-up Appointments:   Hospital Course by problem list: Active Problems:   Respiratory acidosis   1. Headache    Erica Simmons with a headache for the past 2 weeks. The headache comes and goes but has progressively been getting worse, she rates the pain 7/10. It is worsened with movement and she takes excedrin and  Percocet to relieve the pain. She denies photophobia or phonophobia. She has also been experiencing dizziness, nausea and vomiting 1.  She has a history of chronic neck and back pain. MRI was performed 05/15/2016 for workup of her neck pain and found incidental 6 mm pituitary adenoma. Repeat MRI  Her husband has noticed a gradual loss of her peripheral vision and increased word finding difficulty recently.  She also reported weakness of her arms legs and shoulders for the past 5 weeks. She has had joint pain in her knees, ankles and shoulders. Giant cell arteritis was considered however ESR performed 8/26 was normal  Neurology was consulted and with evaluated of the frequency of her  pain medication and over the counter headache medication regiment thought that these headaches may be rebound in nature.  Plans for further workup could not be completed as Erica Simmons   Nicotine addiction  She was given a nicoderm 14 mg patch   Discharge Vitals:   BP 99/61 (BP Location: Right Arm)   Pulse 84   Temp 98.7 F (37.1 C) (Oral)   Resp 17   Ht '5\' 3"'  (1.6 m)   Wt 166 lb 3.6 oz (75.4 kg)   SpO2 96%   BMI 29.45 kg/m   Pertinent Labs, Studies, and Procedures: Sed Rate 05/31/2016 20 TSH 05/31/2016 1.879  Procedures Performed:  Mr Jeri Cos GU  Contrast  Result Date: 05/31/2016 CLINICAL DATA:  Abnormal MRI. Possible pituitary microadenoma. Chronic headaches. Normal prolactin. EXAM: MRI HEAD WITHOUT AND WITH CONTRAST TECHNIQUE: Multiplanar, multiecho pulse sequences of the brain and surrounding structures were obtained without and with intravenous contrast. CONTRAST:  10 mL MultiHance IV COMPARISON:  MRI head with contrast 05/15/2016 FINDINGS: Abbreviated protocol performed of the pituitary gland given the recent study. In addition, the patient had a panic attack and was not able to hold still or complete the entire study. Given the amount of motion, there is not adequate soft tissue detail to evaluate the pituitary accurately. Overall pituitary not enlarged. Again there is a possible hypo enhancing nodule in the right pituitary gland as noted previously. This is unchanged and may represent a microadenoma. This could be a non prolactin secreting microadenoma. No compression of the optic chiasm.  Cavernous sinus normal. Postcontrast imaging of the whole head is markedly motion degraded. IMPRESSION: Unfortunately, the study is significantly degraded by motion as the patient had a panic attack during the scan. There remains hypo enhancing nodule in the right pituitary, suspicious for microadenoma measuring approximately 6 mm. Electronically Signed   By: Franchot Gallo M.D.   On: 05/31/2016 16:23   Mr Jeri Cos YQ Contrast  Result Date: 05/15/2016 CLINICAL DATA:  Cervical pain.  Headache and vision change. EXAM: MRI HEAD WITHOUT AND WITH CONTRAST TECHNIQUE: Multiplanar, multiecho pulse sequences of the brain and surrounding structures were obtained without and with intravenous contrast. CONTRAST:  15 mL MultiHance IV COMPARISON:  CT head 11/10/2015 FINDINGS: Ventricle size is normal.  Cerebral volume is normal. Negative for acute or chronic infarction Negative for demyelinating disease. Cerebral white matter is normal. Basal ganglia and brainstem and  cerebellum normal. Negative for hemorrhage or fluid collection. Negative for edema or mass effect. Postcontrast imaging demonstrates normal enhancement of the brain. There is a possible pituitary microadenoma on the right best seen on coronal postcontrast imaging. The displaces the infundibulum to the left. This would require dedicated MRI of the pituitary to confirm. Correlate with prolactin level. Cavernous sinus normal. No compression of the optic chiasm. Mucous retention cyst in the right maxillary sinus. Mild mucosal edema in the paranasal sinuses. Normal orbital structures. IMPRESSION: Possible 6 mm pituitary microadenoma on the right. Correlate with prolactin level. This would need confirmation with dedicated MRI of the brain without and with contrast with attention to the pituitary. Otherwise negative MRI of the brain with contrast Sinus mucosal disease in the paranasal sinuses. Electronically Signed   By: Franchot Gallo M.D.   On: 05/15/2016 10:56   Dg Chest Port 1 View  Result Date: 05/31/2016 CLINICAL DATA:  Hypoxia.  Dizziness and headache for 2 weeks. EXAM: PORTABLE CHEST 1 VIEW COMPARISON:  11/06/2015 FINDINGS: Linear scarring at the right  base. Heart and mediastinal contours are within normal limits. No other focal opacities or effusions. No acute bony abnormality. IMPRESSION: No active disease. Electronically Signed   By: Rolm Baptise M.D.   On: 05/31/2016 10:43    Consultations: Neurology  Discharge Instructions: Patient left Simmons   Signed: Ledell Noss, MD 06/02/2016, 11:44 AM   Pager: [phone]

## 2016-05-31 NOTE — ED Notes (Signed)
Respiratory at bedside.

## 2016-05-31 NOTE — ED Notes (Signed)
Carrie RT recommend oxygen. 2 L [place] applied.

## 2016-05-31 NOTE — ED Notes (Signed)
Neurology MD at bedside

## 2016-05-31 NOTE — ED Notes (Signed)
RN notified regarding decrease in blood pressure

## 2016-05-31 NOTE — Progress Notes (Signed)
Room air ABG results given to Dr Winfred Leeds- per MD, no bipap needed now, just observe pt.  RN aware. Pt is on 2 lpm [place] now, sat 98%

## 2016-05-31 NOTE — ED Notes (Signed)
Pt ambulated to and from restroom; tolerated well 

## 2016-05-31 NOTE — H&P (Signed)
Date: 05/31/2016               Patient Name:  Erica Simmons MRN: [id number]  DOB: 03-Dec-1970 Age / Sex: 45 y.o., female   PCP: Celene Squibb, MD         Medical Service: Internal Medicine Teaching Service         Attending Physician: Dr. Aldine Contes, MD    First Contact: Dr. Ledell Noss  Pager: [phone]  Second Contact: Dr. Charlott Rakes   Pager: [phone]       After Hours (After 5p/  First Contact Pager: [phone]  weekends / holidays): Second Contact Pager: [phone]   Chief Complaint: headache   History of Present Illness: Erica Simmons is a 45 y.o. female with a PMH of tobacco abuse, pituitary microadenoma, and endometriosis who presents with a headache for the past 2 weeks. Ms. Patella is very tired but her husband is present in the room and assist with the majority of this history. The headache comes and goes but has progressively been getting worse, she rates the pain 7/10. It is worsened with movement and she takes excedrin to relieve the pain. She denies photophobia or phonophobia. She has also been experiencing dizziness, yesterday she started having nausea with vomiting 1.  She has a history of chronic neck and back pain. MRI was performed 05/15/2016 for workup of her neck pain and found incidental 6 mm pituitary adenoma. Her husband has noticed a gradual loss of her peripheral vision and increased word finding difficulty recently. She had hysterectomy and oophorectomy for endometriosis at age 37 so her menstural symptoms can not be assessed.  She has also had weakness of her arms legs and shoulders for the past 5 weeks. She has had joint pain in her knees, ankles and shoulders.   She denies any sick contacts, chest pain, difficulty breathing, abdominal pain, diarrhea, or dysuria.   Meds:  Percocet three times daily   Allergies: Allergies as of 05/31/2016 - Review Complete 05/31/2016  Allergen Reaction Noted  . Lyrica [pregabalin]  02/21/2016   Past Medical  History:  Diagnosis Date  . Elevated liver enzymes   . Fibromyalgia   . Hyperlipemia   . Prediabetes   . Restless leg syndrome   . Syncope    Family History:  Mother - died of cirrhosis  Father- healthy   Social History:  She is not currently working, lives with her husband. She has smoked cigarettes for the last 15 years, 1/2 pack per day currently. She drinks alcohol on social occasions and denies illicit drug use.   Review of Systems: A complete ROS was negative except as per HPI.   Physical Exam: Vitals:   05/31/16 1032 05/31/16 1033 05/31/16 1034 05/31/16 1301  BP: 115/79  115/79 93/64  Pulse:  74 78 72  Resp:   13 12  Temp:      TempSrc:      SpO2:  100% 100% 96%  Weight:      Height:       Physical Exam  Constitutional: She appears well-developed and well-nourished. She appears lethargic. No distress.  HENT:  Head: Normocephalic and atraumatic.  Mouth/Throat: No oropharyngeal exudate.  Eyes: EOM are normal.  Neck: Neck supple.  Cardiovascular: Normal rate and regular rhythm.   No murmur heard. Pulmonary/Chest: No respiratory distress. She has no wheezes. She has no rales.  Abdominal: Soft. She exhibits no distension. There is no tenderness.  Lymphadenopathy:  She has cervical adenopathy.  Neurological: She has normal strength. She appears lethargic. No cranial nerve deficit.   Labs: CBC:  Recent Labs Lab 05/31/16 0909  WBC 8.2  NEUTROABS 4.7  HGB 13.3  HCT 41.5  MCV 97.4  PLT 951    Basic Metabolic Panel:  Recent Labs Lab 05/31/16 0909  NA 135  K 4.1  CL 100*  CO2 28  GLUCOSE 104*  BUN 7  CREATININE 0.84  CALCIUM 9.6   Liver Function Tests:  Recent Labs Lab 05/31/16 0909  AST 35  ALT 41  ALKPHOS 62  BILITOT 0.5  PROT 6.9  ALBUMIN 4.0    Imaging: Dg Chest Port 1 View  Result Date: 05/31/2016 CLINICAL DATA:  Hypoxia.  Dizziness and headache for 2 weeks. EXAM: PORTABLE CHEST 1 VIEW COMPARISON:  11/06/2015 FINDINGS: Linear  scarring at the right base. Heart and mediastinal contours are within normal limits. No other focal opacities or effusions. No acute bony abnormality. IMPRESSION: No active disease. Electronically Signed   By: Rolm Baptise M.D.   On: 05/31/2016 10:43   Assessment & Plan by Problem: Erica Simmons is a 45 y.o. female with PMH obacco abuse, pituitary microadenoma, and endometriosis. Presented today with headache and vision loss.   Active Problems:   Respiratory acidosis  1. Headache - CT head negative. Neurology was consulted, they feel the headaches are rebound in nature considering the recent high frequency of her  analgesic use. With this headache and vision change in the setting of her joint complaints, giant cell arteritis was a concern however ESR was normal. We will hold off on giving her any opioid pain medications at this time as this may exacerbate her rebound headache symptoms. Her symptoms of nausea, vomiting, and dizziness are most likely secondary to her headache.  -continue toradol 30 mg q6h  -follow up MRI   Joint pain and muscle weakness An inflammatory condition is less likely to be related to these symptoms as she has a negative ESR. These symptoms may be related to her chronic back pain and fibromyalgia. Will continue to monitor.   Nicotine addiction  - continue nicoderm 14 mg patch   F none  E none N Heart healthy   DVT Ppx Lovenox  Code Status FULL   Dispo: Admit patient to Observation with expected length of stay less than 2 midnights .  Signed: Ledell Noss, MD 05/31/2016, 1:35 PM  Pager: [phone]

## 2016-05-31 NOTE — ED Notes (Signed)
Pt up and ambulated to restroom with one person assist gait is steady

## 2016-05-31 NOTE — ED Notes (Signed)
Attempted report 

## 2016-05-31 NOTE — ED Notes (Signed)
Respiratory at bedside to obtain arterial blood gas.

## 2016-05-31 NOTE — Consult Note (Signed)
Initial Neurological Consultation                      NEURO HOSPITALIST CONSULT NOTE   Requestig physician: Dr. Dareen Piano   Reason for Consult:  Headaches and blurring of vision.   HPI:                                                                                                                                          Erica Simmons is an 45 y.o. female who has a long history of headache. She reports the headaches have gradually progressed over the course of the past 3 months. She notes that she medicates with Alka-Seltzer and Goody powder for the headaches. The headaches are sometimes associated with a sensation of tunnel vision. The headaches are not associated with any photopsia or fortification spectra. She reports some photophobia and phonophobia. They are occasionally associated with nausea but typically not any vomiting. There is no distinct aura or prodrome with headaches. They typically begin in the morning and persist throughout the day.  Erica Simmons reports that she does not have a dedicated exercise program. She had a recent dental evaluation that was normal. She has not had a recent ophthalmology evaluation. She reports sleeping reasonably well at night. He is describing no focal neurological complaints.  Approximately 3 months ago Erica Simmons was medicating about twice a week for the headaches. A month later she was medicating approximately every other day. For the past month she has been taking medication on a daily basis. In addition to nonnarcotic over-the-counter analgesics she takes oxycodone at least once to 3 times a day. The oxycodone was prescribed for arthritic pain and fibromyalgia.  An MRI of the brain was performed on 05/15/2016. That study revealed the presence of a small 6 mm pituitary microadenoma. Her primary care physician had checked a prolactin level which was normal.    Past Medical History:  Diagnosis Date  . Elevated liver enzymes   . Fibromyalgia   .  Hyperlipemia   . Prediabetes   . Restless leg syndrome   . Syncope     Past Surgical History:  Procedure Laterality Date  . ABDOMINAL HYSTERECTOMY  2000   Total Hysterectomy for Endometriosis  . ULNAR NERVE REPAIR      MEDICATIONS:  I have reviewed the patient's current medications.  Allergies  Allergen Reactions  . Lyrica [Pregabalin]     Mood swings     Social History:  reports that she has been smoking.  She has been smoking about 0.50 packs per day. She has never used smokeless tobacco. She reports that she drinks alcohol. She reports that she does not use drugs.  Family History  Problem Relation Age of Onset  . Liver disease Mother   . Diabetes Mother   . Heart disease Maternal Grandmother   . Osteoporosis Maternal Grandmother   . Diabetes Maternal Grandfather   . Heart disease Paternal Grandfather   . Breast cancer Maternal Aunt   . Ovarian cancer Sister      ROS:                                                                                                                                       History obtained from chart review  General ROS: negative for - chills, fatigue, fever, night sweats, weight gain or weight loss Psychological ROS: negative for - behavioral disorder, hallucinations, memory difficulties, mood swings or suicidal ideation Ophthalmic ROS: negative for - blurry vision, double vision, eye pain or loss of vision ENT ROS: negative for - epistaxis, nasal discharge, oral lesions, sore throat, tinnitus or vertigo Allergy and Immunology ROS: negative for - hives or itchy/watery eyes Hematological and Lymphatic ROS: negative for - bleeding problems, bruising or swollen lymph nodes Endocrine ROS: negative for - galactorrhea, hair pattern changes, polydipsia/polyuria or temperature intolerance Respiratory ROS: negative for - cough,  hemoptysis, shortness of breath or wheezing Cardiovascular ROS: negative for - chest pain, dyspnea on exertion, edema or irregular heartbeat Gastrointestinal ROS: negative for - abdominal pain, diarrhea, hematemesis, nausea/vomiting or stool incontinence Genito-Urinary ROS: negative for - dysuria, hematuria, incontinence or urinary frequency/urgency Musculoskeletal ROS: negative for - joint swelling or muscular weakness Neurological ROS: as noted in HPI Dermatological ROS: negative for rash and skin lesion changes   General Exam                                                                                                      Blood pressure 115/79, pulse 78, temperature 98.5 F (36.9 C), temperature source Oral, resp. rate 13, height 5\' 3"  (1.6 m), weight 74.8 kg (165 lb), SpO2 100 %. HEENT-  Normocephalic, no lesions, without obvious abnormality.  Normal external eye and conjunctiva.  Normal TM's bilaterally.  Normal auditory canals and  external ears. Normal external nose, mucus membranes and septum.  Normal pharynx. Cardiovascular- regular rate and rhythm, S1, S2 normal, no murmur, click, rub or gallop, pulses palpable throughout   Lungs- chest clear, no wheezing, rales, normal symmetric air entry, Heart exam - S1, S2 normal, no murmur, no gallop, rate regular Abdomen- soft, non-tender; bowel sounds normal; no masses,  no organomegaly Extremities- less then 2 second capillary refill   Neurological Examination Mental Status: Alert, oriented, thought content appropriate.  Speech fluent without evidence of aphasia.  Able to follow 3 step commands without difficulty. Cranial Nerves: II: Discs flat bilaterally; Visual fields grossly normal, pupils equal, round, reactive to light and accommodation III,IV, VI: ptosis not present, extra-ocular motions intact bilaterally V,VII: smile symmetric, facial light touch sensation normal bilaterally VIII: hearing normal bilaterally IX,X: uvula rises  symmetrically XI: bilateral shoulder shrug XII: midline tongue extension Motor: Right : Upper extremity   5/5    Left:     Upper extremity   5/5  Lower extremity   5/5     Lower extremity   5/5 Tone and bulk:normal tone throughout; no atrophy noted Sensory: Pinprick and light touch intact throughout, bilaterally Deep Tendon Reflexes: 2+ and symmetric throughout Plantars: Right: downgoing   Left: downgoing Cerebellar: normal finger-to-nose, normal rapid alternating movements and normal heel-to-shin test Gait: normal gait and station      Lab Results: Basic Metabolic Panel:  Recent Labs Lab 05/31/16 0909  NA 135  K 4.1  CL 100*  CO2 28  GLUCOSE 104*  BUN 7  CREATININE 0.84  CALCIUM 9.6    Liver Function Tests:  Recent Labs Lab 05/31/16 0909  AST 35  ALT 41  ALKPHOS 62  BILITOT 0.5  PROT 6.9  ALBUMIN 4.0   No results for input(s): LIPASE, AMYLASE in the last 168 hours. No results for input(s): AMMONIA in the last 168 hours.  CBC:  Recent Labs Lab 05/31/16 0909  WBC 8.2  NEUTROABS 4.7  HGB 13.3  HCT 41.5  MCV 97.4  PLT 324    Cardiac Enzymes: No results for input(s): CKTOTAL, CKMB, CKMBINDEX, TROPONINI in the last 168 hours.  Lipid Panel: No results for input(s): CHOL, TRIG, HDL, CHOLHDL, VLDL, LDLCALC in the last 168 hours.  CBG: No results for input(s): GLUCAP in the last 168 hours.  Microbiology: No results found for this or any previous visit.  Coagulation Studies: No results for input(s): LABPROT, INR in the last 72 hours.  Imaging: Dg Chest Port 1 View  Result Date: 05/31/2016 CLINICAL DATA:  Hypoxia.  Dizziness and headache for 2 weeks. EXAM: PORTABLE CHEST 1 VIEW COMPARISON:  11/06/2015 FINDINGS: Linear scarring at the right base. Heart and mediastinal contours are within normal limits. No other focal opacities or effusions. No acute bony abnormality. IMPRESSION: No active disease. Electronically Signed   By: Rolm Baptise M.D.   On:  05/31/2016 10:43       Assessment/Plan:  Altha Harm is a pleasant 45 year old patient who presents with a history of headaches that have been present for approximately 3 months. These headaches are likely multifactorial in nature. It is likely that she is experiencing some occasional migraine and tension headaches. However at this point it is likely that the majority of her headaches are rebound headaches. The neurologic exam was otherwise normal. The cranial nerve exam was normal with no visual field deficits. There was no papilledema to suggest a disorder such as pseudotumor cerebri. There were no focal neurological deficits.  At this point conservative measures for headache are advised. It would be best to gradually taper off the narcotic analgesics as they are not an optimal therapy for arthritic pain or for fibromyalgia. It is likely the oxycodone is further contributing to rebound headaches. The patient should only take nonnarcotic analgesics once or twice a week at most to avoid rebound headaches. Other conservative measures such as a dedicated exercise program, warm baths, massage therapy, and meditation are likely to be of significant benefit.  For acute treatment of the present headache combination of Toradol with another medication such as Compazine or Reglan is likely to be optimally beneficial. It would be best to avoid additional narcotic therapy given her respiratory issues that are noted today.  There is no evidence of an infectious process such as meningitis during today's visit. However grossly does complain of some mild neck pain. Should she fail to respond to medication for her headache, consideration could be given to an LP to rule out an infectious process as well as to check the intracranial pressure.  The MRI revealed incidental note of a pituitary microadenoma. This can be followed by the endocrinology and neurology services on an outpatient basis.the presentation does not  appear suggestive of pituitary apoplexy.   Plan:  1. Conservative measures for headache as above.  2. Pain medication as above.  3. Neurology will sign off at this point. Should there be any new issues please feel free to reconsult.   Thank you for consulting neurology service to assist in the care of your patient.    Demetric Parslow A. Tasia Catchings, M.D. Neurohospitalist Phone: [phone]  05/31/2016, 12:50 PM

## 2016-05-31 NOTE — ED Notes (Addendum)
Dr. Winfred Leeds at bedside, pt got up with RNS at bedside, pt was able to ambulate, she ambulated to the door and back to the bed. Under the direction of Dr. Winfred Leeds pt closed her eyes while standing. PT did sway back and forth a little while she had her eyes closed.

## 2016-05-31 NOTE — ED Provider Notes (Signed)
[facility] DEPT Provider Note   CSN: [id number] Arrival date & time: 05/31/16  [id number]     History   Chief Complaint Chief Complaint  Patient presents with  . Migraine    HPI Enas Suero is a 45 y.o. female.Combines of headache gradual onset 3-4 weeks ago. Currently by nausea and photophobia and states "it looks like him looking through someone else's glasses." No fever. She is treated herself with Percocet and goody without relief. Patient had MRI of her brain performed 05/15/2016 which showed a possible 6 mm pituitary microadenoma suggest correlate with prolactin level. Patient's husband reports that her primary care physician Dr. [place] Crane had prolactin level subsequently drawn which came back normal. Her husband reports that she's also been more sleepy for the past 3 or 4 days. No other associated symptoms. She treated self with Percocet yesterday 6 PM and again at 3 AM today. She's been sleeping less for the past several days.  HPI  Past Medical History:  Diagnosis Date  . Elevated liver enzymes   . Fibromyalgia   . Hyperlipemia   . Prediabetes   . Restless leg syndrome   . Syncope     Patient Active Problem List   Diagnosis Date Noted  . Restless leg syndrome 12/05/2015  . Pain in limb 12/05/2015  . Faintness   . Syncopal episodes 11/10/2015  . Tobacco abuse 11/10/2015  . Elevated liver enzymes 11/10/2015  . Bone pain 11/10/2015    Past Surgical History:  Procedure Laterality Date  . ABDOMINAL HYSTERECTOMY  2000   Total Hysterectomy for Endometriosis  . ULNAR NERVE REPAIR      OB History    No data available       Home Medications    Prior to Admission medications   Medication Sig Start Date End Date Taking? Authorizing Provider  buPROPion (WELLBUTRIN XL) 150 MG 24 hr tablet TK 1 T PO D 04/01/16   Historical Provider, MD  Multiple Vitamins-Minerals (HM MULTIVITAMIN ADULT GUMMY PO) Take 1 tablet by mouth daily.    Historical Provider, MD  omeprazole  (PRILOSEC) 20 MG capsule Take 1 capsule (20 mg total) by mouth daily. 04/20/16   Margette Fast, MD  ondansetron (ZOFRAN) 4 MG tablet Take 1 tablet (4 mg total) by mouth every 8 (eight) hours as needed for nausea or vomiting. 04/20/16   Margette Fast, MD  Oxycodone HCl 10 MG TABS Take 1 tablet by mouth 3 (three) times daily as needed (for pain). Reported on 02/21/2016 01/28/16   Historical Provider, MD  oxyCODONE-acetaminophen (PERCOCET/ROXICET) 5-325 MG tablet Take 2 tablets by mouth every 4 (four) hours as needed for severe pain. 04/20/16   Margette Fast, MD  phentermine (ADIPEX-P) 37.5 MG tablet Take 1 tablet by mouth daily. 02/12/16   Historical Provider, MD  sucralfate (CARAFATE) 1 g tablet Take 1 tablet (1 g total) by mouth 4 (four) times daily -  with meals and at bedtime. 04/20/16   Margette Fast, MD  tizanidine (ZANAFLEX) 2 MG capsule Take 1 capsule (2 mg total) by mouth 3 (three) times daily. 04/03/16   Marcial Pacas, MD    Family History Family History  Problem Relation Age of Onset  . Liver disease Mother   . Diabetes Mother   . Heart disease Maternal Grandmother   . Osteoporosis Maternal Grandmother   . Diabetes Maternal Grandfather   . Heart disease Paternal Grandfather   . Breast cancer Maternal Aunt   . Ovarian cancer Sister  Social History Social History  Substance Use Topics  . Smoking status: Current Every Day Smoker    Packs/day: 0.50  . Smokeless tobacco: Never Used  . Alcohol use 0.0 oz/week     Comment: 1-2 times per month     Allergies   Lyrica [pregabalin]   Review of Systems Review of Systems  Constitutional: Positive for fatigue.  Eyes: Positive for photophobia and visual disturbance.  Respiratory: Negative.   Cardiovascular: Negative.   Gastrointestinal: Negative.   Musculoskeletal: Negative.   Skin: Negative.   Neurological: Positive for headaches.  Psychiatric/Behavioral: Negative.   All other systems reviewed and are negative.    Physical  Exam Updated Vital Signs BP 124/80   Pulse 89   Temp 98.5 F (36.9 C) (Oral)   Ht 5\' 3"  (1.6 m)   Wt 165 lb (74.8 kg)   SpO2 92%   BMI 29.23 kg/m   Physical Exam  Constitutional: She is oriented to person, place, and time. She appears well-developed and well-nourished.  Appears mildly uncomfortable, slightly sleepy however Glasgow Coma Score 15  HENT:  Head: Normocephalic and atraumatic.  Eyes: Conjunctivae are normal. Pupils are equal, round, and reactive to light.  Pupils 2-3 mm reactive to light  Neck: Neck supple. No tracheal deviation present. No thyromegaly present.  Cardiovascular: Normal rate and regular rhythm.   No murmur heard. Pulmonary/Chest: Effort normal and breath sounds normal.  Abdominal: Soft. Bowel sounds are normal. She exhibits no distension. There is no tenderness.  Musculoskeletal: Normal range of motion. She exhibits no edema or tenderness.  Neurological: She is alert and oriented to person, place, and time. She has normal reflexes. No cranial nerve deficit. Coordination normal.  Gait normal Romberg normal pronator drift normal finger to nose normal DTR symmetric bilaterally at knee jerk ankle jerk and biceps toes downward going bilaterally  Skin: Skin is warm and dry. No rash noted.  Psychiatric: She has a normal mood and affect.  Nursing note and vitals reviewed.    ED Treatments / Results  Labs (all labs ordered are listed, but only abnormal results are displayed) Labs Reviewed  COMPREHENSIVE METABOLIC PANEL  CBC WITH DIFFERENTIAL/PLATELET  BLOOD GAS, ARTERIAL    EKG  EKG Interpretation None       Radiology No results found.  Procedures Procedures (including critical care time) Chest x-ray viewed by me Medications Ordered in ED Medications  metoCLOPramide (REGLAN) injection 10 mg (not administered)   10:50 AM reports no pain relief after treatment with intravenous Reglan Results for orders placed or performed during the hospital  encounter of 05/31/16  Comprehensive metabolic panel  Result Value Ref Range   Sodium 135 135 - 145 mmol/L   Potassium 4.1 3.5 - 5.1 mmol/L   Chloride 100 (L) 101 - 111 mmol/L   CO2 28 22 - 32 mmol/L   Glucose, Bld 104 (H) 65 - 99 mg/dL   BUN 7 6 - 20 mg/dL   Creatinine, Ser 0.84 0.44 - 1.00 mg/dL   Calcium 9.6 8.9 - 10.3 mg/dL   Total Protein 6.9 6.5 - 8.1 g/dL   Albumin 4.0 3.5 - 5.0 g/dL   AST 35 15 - 41 U/L   ALT 41 14 - 54 U/L   Alkaline Phosphatase 62 38 - 126 U/L   Total Bilirubin 0.5 0.3 - 1.2 mg/dL   GFR calc non Af Amer >60 >60 mL/min   GFR calc Af Amer >60 >60 mL/min   Anion gap 7 5 - 15  CBC with Differential/Platelet  Result Value Ref Range   WBC 8.2 4.0 - 10.5 K/uL   RBC 4.26 3.87 - 5.11 MIL/uL   Hemoglobin 13.3 12.0 - 15.0 g/dL   HCT 41.5 36.0 - 46.0 %   MCV 97.4 78.0 - 100.0 fL   MCH 31.2 26.0 - 34.0 pg   MCHC 32.0 30.0 - 36.0 g/dL   RDW 14.4 11.5 - 15.5 %   Platelets 324 150 - 400 K/uL   Neutrophils Relative % 58 %   Neutro Abs 4.7 1.7 - 7.7 K/uL   Lymphocytes Relative 35 %   Lymphs Abs 2.8 0.7 - 4.0 K/uL   Monocytes Relative 4 %   Monocytes Absolute 0.3 0.1 - 1.0 K/uL   Eosinophils Relative 3 %   Eosinophils Absolute 0.3 0.0 - 0.7 K/uL   Basophils Relative 0 %   Basophils Absolute 0.0 0.0 - 0.1 K/uL  I-Stat arterial blood gas, ED  Result Value Ref Range   pH, Arterial 7.536 (H) 7.350 - 7.450   pCO2 arterial 34.3 (L) 35.0 - 45.0 mmHg   pO2, Arterial 36.0 (LL) 80.0 - 100.0 mmHg   Bicarbonate 29.1 (H) 20.0 - 24.0 mEq/L   TCO2 30 0 - 100 mmol/L   O2 Saturation 77.0 %   Acid-Base Excess 6.0 (H) 0.0 - 2.0 mmol/L   Patient temperature 98.5 F    Collection site RADIAL, ALLEN'S TEST ACCEPTABLE    Drawn by Operator    Sample type ARTERIAL    Comment MD NOTIFIED, SUGGEST RECOLLECT   I-Stat arterial blood gas, ED  Result Value Ref Range   pH, Arterial 7.331 (L) 7.350 - 7.450   pCO2 arterial 56.7 (H) 35.0 - 45.0 mmHg   pO2, Arterial 51.0 (L) 80.0 -  100.0 mmHg   Bicarbonate 30.0 (H) 20.0 - 24.0 mEq/L   TCO2 32 0 - 100 mmol/L   O2 Saturation 82.0 %   Acid-Base Excess 3.0 (H) 0.0 - 2.0 mmol/L   Patient temperature 98.5 F    Collection site RADIAL, ALLEN'S TEST ACCEPTABLE    Drawn by Operator    Sample type ARTERIAL    Mr Jeri Cos Wo Contrast  Result Date: 05/15/2016 CLINICAL DATA:  Cervical pain.  Headache and vision change. EXAM: MRI HEAD WITHOUT AND WITH CONTRAST TECHNIQUE: Multiplanar, multiecho pulse sequences of the brain and surrounding structures were obtained without and with intravenous contrast. CONTRAST:  15 mL MultiHance IV COMPARISON:  CT head 11/10/2015 FINDINGS: Ventricle size is normal.  Cerebral volume is normal. Negative for acute or chronic infarction Negative for demyelinating disease. Cerebral white matter is normal. Basal ganglia and brainstem and cerebellum normal. Negative for hemorrhage or fluid collection. Negative for edema or mass effect. Postcontrast imaging demonstrates normal enhancement of the brain. There is a possible pituitary microadenoma on the right best seen on coronal postcontrast imaging. The displaces the infundibulum to the left. This would require dedicated MRI of the pituitary to confirm. Correlate with prolactin level. Cavernous sinus normal. No compression of the optic chiasm. Mucous retention cyst in the right maxillary sinus. Mild mucosal edema in the paranasal sinuses. Normal orbital structures. IMPRESSION: Possible 6 mm pituitary microadenoma on the right. Correlate with prolactin level. This would need confirmation with dedicated MRI of the brain without and with contrast with attention to the pituitary. Otherwise negative MRI of the brain with contrast Sinus mucosal disease in the paranasal sinuses. Electronically Signed   By: Franchot Gallo M.D.   On: 05/15/2016 10:56  Dg Chest Port 1 View  Result Date: 05/31/2016 CLINICAL DATA:  Hypoxia.  Dizziness and headache for 2 weeks. EXAM: PORTABLE  CHEST 1 VIEW COMPARISON:  11/06/2015 FINDINGS: Linear scarring at the right base. Heart and mediastinal contours are within normal limits. No other focal opacities or effusions. No acute bony abnormality. IMPRESSION: No active disease. Electronically Signed   By: Rolm Baptise M.D.   On: 05/31/2016 10:43   Initial Impression / Assessment and Plan / ED Course  I have reviewed the triage vital signs and the nursing notes.   Pertinent labs & imaging results that were available during my care of the patient were reviewed by me and considered in my medical decision making (see chart for details).  Clinical Course   Patient respiratory acidosis and hypoxemia.  Concerned that she may be narcotized. Patient is mildly somnolent but does not require airway management at this point. Patient placed on supplemental oxygen Dr. Posey Pronto consulted Final Clinical Impressions(s) / ED Diagnoses  Plan 23 hour observation step down unit Final diagnoses:  None  Diagnoses #1 headache   #2 hypoxia #3 respiratory acidosis  New Prescriptions New Prescriptions   No medications on file     Orlie Dakin, MD 05/31/16 1059

## 2016-05-31 NOTE — Progress Notes (Addendum)
Called ED to get report , RN unable to give report at this time. Consuelo Pandy RN

## 2016-07-02 ENCOUNTER — Encounter: Payer: Self-pay | Admitting: Endocrinology

## 2016-07-02 ENCOUNTER — Ambulatory Visit (INDEPENDENT_AMBULATORY_CARE_PROVIDER_SITE_OTHER): Payer: 59 | Admitting: Endocrinology

## 2016-07-02 DIAGNOSIS — D352 Benign neoplasm of pituitary gland: Secondary | ICD-10-CM

## 2016-07-02 MED ORDER — DEXAMETHASONE 1 MG PO TABS: ORAL_TABLET | ORAL | 0 refills | Status: DC

## 2016-07-02 NOTE — Patient Instructions (Addendum)
You should do a "dexamethasone suppression test."  For this, you would take dexamethasone 1 mg at 10 pm (I have sent a prescription to your pharmacy), then come in for a "cortisol" blood test the next morning before 9 am.  You do not need to be fasting for this test. Other blood tests are also requested for you, to be done at the same time.  We'll let you know about the results.  Please measure a 24-HR urine volume, and let us know how much it is.   I would be happy to see you back here as needed.

## 2016-07-02 NOTE — Progress Notes (Signed)
Subjective:    Patient ID: Erica Simmons, female    DOB: 01-03-1971, 45 y.o.   MRN: [id number]  HPI Pt reports 2 years of intermittent severe pain throughout the head, and assoc difficulty with concentration.  She had MRI in the eval of this.  She was incidentally noted to have pituitary nodule.  She has no prior h/o pituitary problems.  Past Medical History:  Diagnosis Date  . Elevated liver enzymes   . Fibromyalgia   . Hyperlipemia   . Prediabetes   . Restless leg syndrome   . Syncope     Past Surgical History:  Procedure Laterality Date  . ABDOMINAL HYSTERECTOMY  2000   Total Hysterectomy for Endometriosis  . ULNAR NERVE REPAIR      Social History   Social History  . Marital status: Married    Spouse name: N/A  . Number of children: 2  . Years of education: HS   Occupational History  . Homemaker    Social History Main Topics  . Smoking status: Current Every Day Smoker    Packs/day: 0.50    Types: Cigarettes  . Smokeless tobacco: Never Used  . Alcohol use 0.0 oz/week     Comment: 1-2 times per month  . Drug use: No  . Sexual activity: Yes    Partners: Male   Other Topics Concern  . Not on file   Social History Narrative   Lives at home with husband and daughter.   Right-handed.   2 cups caffeine daily.    Current Outpatient Prescriptions on File Prior to Visit  Medication Sig Dispense Refill  . ALPRAZolam (XANAX) 0.5 MG tablet Take 0.5 mg by mouth 2 (two) times daily as needed for anxiety.    Marland Kitchen esomeprazole (NEXIUM) 40 MG capsule Take 40 mg by mouth daily at 12 noon.    . Multiple Vitamins-Minerals (HM MULTIVITAMIN ADULT GUMMY PO) Take 1 tablet by mouth daily.    . ondansetron (ZOFRAN) 4 MG tablet Take 1 tablet (4 mg total) by mouth every 8 (eight) hours as needed for nausea or vomiting. 20 tablet 0  . Oxycodone HCl 10 MG TABS Take 1 tablet by mouth 3 (three) times daily as needed (for pain). Reported on 02/21/2016  0  . phentermine (ADIPEX-P) 37.5  MG tablet Take 1 tablet by mouth daily.  0  . oxyCODONE-acetaminophen (PERCOCET/ROXICET) 5-325 MG tablet Take 2 tablets by mouth every 4 (four) hours as needed for severe pain. (Patient not taking: Reported on 07/02/2016) 6 tablet 0  . sucralfate (CARAFATE) 1 g tablet Take 1 tablet (1 g total) by mouth 4 (four) times daily -  with meals and at bedtime. (Patient not taking: Reported on 07/02/2016) 90 tablet 0  . tizanidine (ZANAFLEX) 2 MG capsule Take 1 capsule (2 mg total) by mouth 3 (three) times daily. (Patient not taking: Reported on 07/02/2016) 90 capsule 6   No current facility-administered medications on file prior to visit.     Allergies  Allergen Reactions  . Lyrica [Pregabalin]     Mood swings    Family History  Problem Relation Age of Onset  . Liver disease Mother   . Diabetes Mother   . Heart disease Maternal Grandmother   . Osteoporosis Maternal Grandmother   . Diabetes Maternal Grandfather   . Heart disease Paternal Grandfather   . Breast cancer Maternal Aunt   . Ovarian cancer Sister   . Headache Neg Hx     BP 126/72  Pulse 97   Ht 5\' 3"  (1.6 m)   Wt 157 lb (71.2 kg)   SpO2 97%   BMI 27.81 kg/m    Review of Systems denies loss of sense of smell, rash, galactorrhea, and easy bruising.  She has generalized body pain, bilat mastalgia, diplopia, nasal congestion, weight gain, alt constip/diarrhea, cold intolerance, anxiety, and urinary frequency.  She says she had several near-syncopal episodes in 2016.  She has no menses, due to TAH.  She has acne and facial hair.      Objective:   Physical Exam VS: see vs page GEN: no distress HEAD: head: no deformity eyes: no periorbital swelling, no proptosis external nose and ears are normal mouth: no lesion seen NECK: supple, thyroid is not enlarged CHEST WALL: no deformity LUNGS: clear to auscultation CV: reg rate and rhythm, no murmur ABD: abdomen is soft, nontender.  no hepatosplenomegaly.  not distended.  no  hernia MUSCULOSKELETAL: muscle bulk and strength are grossly normal.  no obvious joint swelling.  gait is normal and steady EXTEMITIES: no deformity.  no edema PULSES: no carotid bruit NEURO:  cn 2-12 grossly intact.   readily moves all 4's.  sensation is intact to touch on all 4's SKIN:  Normal texture and temperature.  No rash or suspicious lesion is visible.   NODES:  None palpable at the neck PSYCH: alert, well-oriented.  Tearful.     MRI: microadenoma measuring approximately 6 mm  Lab Results  Component Value Date   TSH 1.879 05/31/2016   Lab Results  Component Value Date   CREATININE 0.84 05/31/2016   BUN 7 05/31/2016   NA 135 05/31/2016   K 4.1 05/31/2016   CL 100 (L) 05/31/2016   CO2 28 05/31/2016   F/u MRI (06/20/16) resolution of the pituitary nodule  I have reviewed outside records, and summarized: Pt was referred here, despite resolution of the pituitary nodule.  Her headache was noted to not correlate with the pituitary nodule  PTH=45 Ca++=9.3 Prolactin=23    Assessment & Plan:  Pituitary microadenoma, resolved on recheck.   Urinary frequency, new to me, unliklely pituitary-related.

## 2016-07-03 ENCOUNTER — Other Ambulatory Visit: Payer: Self-pay

## 2016-07-03 ENCOUNTER — Other Ambulatory Visit (INDEPENDENT_AMBULATORY_CARE_PROVIDER_SITE_OTHER): Payer: 59

## 2016-07-03 DIAGNOSIS — D352 Benign neoplasm of pituitary gland: Secondary | ICD-10-CM | POA: Diagnosis not present

## 2016-07-03 LAB — T4, FREE: FREE T4: 0.68 ng/dL (ref 0.60–1.60)

## 2016-07-03 LAB — CORTISOL: Cortisol, Plasma: 0.4 ug/dL

## 2016-07-03 LAB — LUTEINIZING HORMONE: LH: 10.89 m[IU]/mL

## 2016-07-04 LAB — PROLACTIN: PROLACTIN: 3.9 ng/mL

## 2016-07-09 LAB — ARGININE VASOPRESSIN HORMONE
ADH: 1.5 pg/mL (ref 0.0–4.7)
Osmolality Meas: 284 mOsmol/kg (ref 275–295)

## 2016-07-09 LAB — ACTH: ACTH: 1.6 pg/mL — AB (ref 7.2–63.3)

## 2016-08-11 ENCOUNTER — Emergency Department (HOSPITAL_COMMUNITY): Payer: 59

## 2016-08-11 ENCOUNTER — Observation Stay (HOSPITAL_COMMUNITY)
Admission: EM | Admit: 2016-08-11 | Discharge: 2016-08-12 | Disposition: A | Discharge status: Home / Self Care | Payer: 59 | Attending: Internal Medicine | Admitting: Internal Medicine

## 2016-08-11 ENCOUNTER — Encounter (HOSPITAL_COMMUNITY): Payer: Self-pay | Admitting: *Deleted

## 2016-08-11 DIAGNOSIS — K219 Gastro-esophageal reflux disease without esophagitis: Secondary | ICD-10-CM | POA: Diagnosis present

## 2016-08-11 DIAGNOSIS — Z5181 Encounter for therapeutic drug level monitoring: Secondary | ICD-10-CM | POA: Diagnosis not present

## 2016-08-11 DIAGNOSIS — Z79899 Other long term (current) drug therapy: Secondary | ICD-10-CM | POA: Insufficient documentation

## 2016-08-11 DIAGNOSIS — R51 Headache: Secondary | ICD-10-CM | POA: Diagnosis not present

## 2016-08-11 DIAGNOSIS — R4701 Aphasia: Secondary | ICD-10-CM | POA: Diagnosis not present

## 2016-08-11 DIAGNOSIS — R4781 Slurred speech: Secondary | ICD-10-CM | POA: Diagnosis not present

## 2016-08-11 DIAGNOSIS — M6281 Muscle weakness (generalized): Secondary | ICD-10-CM

## 2016-08-11 DIAGNOSIS — Z720 Tobacco use: Secondary | ICD-10-CM | POA: Diagnosis not present

## 2016-08-11 DIAGNOSIS — I6602 Occlusion and stenosis of left middle cerebral artery: Secondary | ICD-10-CM | POA: Insufficient documentation

## 2016-08-11 DIAGNOSIS — D352 Benign neoplasm of pituitary gland: Secondary | ICD-10-CM | POA: Diagnosis not present

## 2016-08-11 DIAGNOSIS — F17210 Nicotine dependence, cigarettes, uncomplicated: Secondary | ICD-10-CM | POA: Insufficient documentation

## 2016-08-11 DIAGNOSIS — R479 Unspecified speech disturbances: Secondary | ICD-10-CM | POA: Diagnosis present

## 2016-08-11 LAB — COMPREHENSIVE METABOLIC PANEL
ALK PHOS: 75 U/L (ref 38–126)
ALT: 22 U/L (ref 14–54)
AST: 23 U/L (ref 15–41)
Albumin: 4.3 g/dL (ref 3.5–5.0)
Anion gap: 7 (ref 5–15)
BILIRUBIN TOTAL: 0.3 mg/dL (ref 0.3–1.2)
BUN: 14 mg/dL (ref 6–20)
CALCIUM: 9.6 mg/dL (ref 8.9–10.3)
CO2: 28 mmol/L (ref 22–32)
CREATININE: 0.8 mg/dL (ref 0.44–1.00)
Chloride: 103 mmol/L (ref 101–111)
Glucose, Bld: 100 mg/dL — ABNORMAL HIGH (ref 65–99)
Potassium: 4.1 mmol/L (ref 3.5–5.1)
Sodium: 138 mmol/L (ref 135–145)
TOTAL PROTEIN: 7.6 g/dL (ref 6.5–8.1)

## 2016-08-11 LAB — I-STAT CHEM 8, ED
BUN: 14 mg/dL (ref 6–20)
CALCIUM ION: 1.07 mmol/L — AB (ref 1.15–1.40)
CHLORIDE: 105 mmol/L (ref 101–111)
Creatinine, Ser: 0.8 mg/dL (ref 0.44–1.00)
GLUCOSE: 98 mg/dL (ref 65–99)
HCT: 45 % (ref 36.0–46.0)
Hemoglobin: 15.3 g/dL — ABNORMAL HIGH (ref 12.0–15.0)
Potassium: 4.2 mmol/L (ref 3.5–5.1)
Sodium: 141 mmol/L (ref 135–145)
TCO2: 27 mmol/L (ref 0–100)

## 2016-08-11 LAB — URINALYSIS, ROUTINE W REFLEX MICROSCOPIC
Bilirubin Urine: NEGATIVE
GLUCOSE, UA: NEGATIVE mg/dL
Hgb urine dipstick: NEGATIVE
Ketones, ur: NEGATIVE mg/dL
LEUKOCYTES UA: NEGATIVE
Nitrite: NEGATIVE
PH: 6 (ref 5.0–8.0)
Protein, ur: NEGATIVE mg/dL
Specific Gravity, Urine: <1.005 — ABNORMAL LOW (ref 1.005–1.030)

## 2016-08-11 LAB — DIFFERENTIAL
BASOS PCT: 0 %
Basophils Absolute: 0 10*3/uL (ref 0.0–0.1)
Eosinophils Absolute: 0.4 10*3/uL (ref 0.0–0.7)
Eosinophils Relative: 5 %
LYMPHS PCT: 47 %
Lymphs Abs: 4 10*3/uL (ref 0.7–4.0)
MONO ABS: 0.3 10*3/uL (ref 0.1–1.0)
MONOS PCT: 4 %
NEUTROS ABS: 3.9 10*3/uL (ref 1.7–7.7)
Neutrophils Relative %: 44 %

## 2016-08-11 LAB — CBC
HEMATOCRIT: 43.7 % (ref 36.0–46.0)
Hemoglobin: 14.8 g/dL (ref 12.0–15.0)
MCH: 32.6 pg (ref 26.0–34.0)
MCHC: 33.9 g/dL (ref 30.0–36.0)
MCV: 96.3 fL (ref 78.0–100.0)
Platelets: 357 10*3/uL (ref 150–400)
RBC: 4.54 MIL/uL (ref 3.87–5.11)
RDW: 13.9 % (ref 11.5–15.5)
WBC: 8.7 10*3/uL (ref 4.0–10.5)

## 2016-08-11 LAB — RAPID URINE DRUG SCREEN, HOSP PERFORMED
AMPHETAMINES: NOT DETECTED
Amphetamines: NOT DETECTED
BARBITURATES: NOT DETECTED
BENZODIAZEPINES: POSITIVE — AB
Barbiturates: NOT DETECTED
Benzodiazepines: POSITIVE — AB
COCAINE: NOT DETECTED
COCAINE: NOT DETECTED
OPIATES: NOT DETECTED
Opiates: NOT DETECTED
TETRAHYDROCANNABINOL: NOT DETECTED
Tetrahydrocannabinol: NOT DETECTED

## 2016-08-11 LAB — PROTIME-INR
INR: 0.86
Prothrombin Time: 11.7 seconds (ref 11.4–15.2)

## 2016-08-11 LAB — TROPONIN I: Troponin I: <0.03 ng/mL (ref <0.03)

## 2016-08-11 LAB — CBG MONITORING, ED: GLUCOSE-CAPILLARY: 90 mg/dL (ref 65–99)

## 2016-08-11 LAB — ETHANOL

## 2016-08-11 LAB — APTT: aPTT: 25 seconds (ref 24–36)

## 2016-08-11 MED ORDER — NICOTINE 14 MG/24HR TD PT24
14.0000 mg | MEDICATED_PATCH | Freq: Every day | TRANSDERMAL | Status: DC
  Administered 2016-08-12: 14 mg via TRANSDERMAL
  Filled 2016-08-11: qty 1

## 2016-08-11 MED ORDER — METHYLPREDNISOLONE SODIUM SUCC 1000 MG IJ SOLR
INTRAMUSCULAR | Status: AC
  Filled 2016-08-11: qty 8

## 2016-08-11 MED ORDER — HYDROMORPHONE HCL 1 MG/ML IJ SOLN
1.0000 mg | Freq: Once | INTRAMUSCULAR | Status: AC
  Administered 2016-08-11: 1 mg via INTRAVENOUS
  Filled 2016-08-11: qty 1

## 2016-08-11 MED ORDER — METOCLOPRAMIDE HCL 5 MG/ML IJ SOLN
10.0000 mg | Freq: Four times a day (QID) | INTRAMUSCULAR | Status: DC
  Administered 2016-08-11 – 2016-08-12 (×5): 10 mg via INTRAVENOUS
  Filled 2016-08-11 (×4): qty 2

## 2016-08-11 MED ORDER — OXYCODONE-ACETAMINOPHEN 5-325 MG PO TABS
2.0000 | ORAL_TABLET | ORAL | Status: DC | PRN
  Administered 2016-08-11: 2 via ORAL
  Filled 2016-08-11: qty 2

## 2016-08-11 MED ORDER — ACETAMINOPHEN 650 MG RE SUPP: 650.0000 mg | RECTAL | Status: DC | PRN

## 2016-08-11 MED ORDER — KETOROLAC TROMETHAMINE 30 MG/ML IJ SOLN
30.0000 mg | Freq: Four times a day (QID) | INTRAMUSCULAR | Status: DC | PRN
  Administered 2016-08-12 (×3): 30 mg via INTRAVENOUS
  Filled 2016-08-11 (×3): qty 1

## 2016-08-11 MED ORDER — ASPIRIN 300 MG RE SUPP: 300.0000 mg | Freq: Every day | RECTAL | Status: DC

## 2016-08-11 MED ORDER — ACETAMINOPHEN 325 MG PO TABS
650.0000 mg | ORAL_TABLET | ORAL | Status: DC | PRN
  Filled 2016-08-11: qty 2

## 2016-08-11 MED ORDER — ASPIRIN 325 MG PO TABS
325.0000 mg | ORAL_TABLET | Freq: Every day | ORAL | Status: DC
  Administered 2016-08-11 – 2016-08-12 (×2): 325 mg via ORAL
  Filled 2016-08-11 (×2): qty 1

## 2016-08-11 MED ORDER — NICOTINE 14 MG/24HR TD PT24
14.0000 mg | MEDICATED_PATCH | Freq: Once | TRANSDERMAL | Status: DC
Start: 2016-08-11 — End: 2016-08-11
  Administered 2016-08-11: 14 mg via TRANSDERMAL
  Filled 2016-08-11: qty 1

## 2016-08-11 MED ORDER — TOPIRAMATE 25 MG PO TABS
25.0000 mg | ORAL_TABLET | Freq: Two times a day (BID) | ORAL | Status: DC
  Administered 2016-08-11 – 2016-08-12 (×2): 25 mg via ORAL
  Filled 2016-08-11 (×2): qty 1

## 2016-08-11 MED ORDER — LORAZEPAM 2 MG/ML IJ SOLN
0.5000 mg | Freq: Once | INTRAMUSCULAR | Status: AC
  Administered 2016-08-11: 0.5 mg via INTRAVENOUS
  Filled 2016-08-11: qty 1

## 2016-08-11 MED ORDER — ONDANSETRON HCL 4 MG PO TABS: 4.0000 mg | ORAL_TABLET | Freq: Three times a day (TID) | ORAL | Status: DC | PRN

## 2016-08-11 MED ORDER — SENNOSIDES-DOCUSATE SODIUM 8.6-50 MG PO TABS: 1.0000 | ORAL_TABLET | Freq: Every evening | ORAL | Status: DC | PRN

## 2016-08-11 MED ORDER — LORAZEPAM 2 MG/ML IJ SOLN: 1.0000 mg | Freq: Once | INTRAMUSCULAR | Status: DC

## 2016-08-11 MED ORDER — ALPRAZOLAM 0.5 MG PO TABS
0.5000 mg | ORAL_TABLET | Freq: Two times a day (BID) | ORAL | Status: DC | PRN
Start: 2016-08-11 — End: 2016-08-13
  Administered 2016-08-11 – 2016-08-12 (×3): 0.5 mg via ORAL
  Filled 2016-08-11 (×3): qty 1

## 2016-08-11 MED ORDER — SODIUM CHLORIDE 0.9 % IV SOLN
INTRAVENOUS | Status: DC
  Administered 2016-08-11: 1000 mL via INTRAVENOUS

## 2016-08-11 MED ORDER — SODIUM CHLORIDE 0.9 % IV SOLN
500.0000 mg | INTRAVENOUS | Status: DC
  Administered 2016-08-11: 500 mg via INTRAVENOUS
  Filled 2016-08-11 (×2): qty 4

## 2016-08-11 MED ORDER — PANTOPRAZOLE SODIUM 40 MG PO TBEC
40.0000 mg | DELAYED_RELEASE_TABLET | Freq: Every day | ORAL | Status: DC
  Administered 2016-08-11 – 2016-08-12 (×2): 40 mg via ORAL
  Filled 2016-08-11 (×2): qty 1

## 2016-08-11 MED ORDER — ENOXAPARIN SODIUM 40 MG/0.4ML ~~LOC~~ SOLN
40.0000 mg | SUBCUTANEOUS | Status: DC
  Administered 2016-08-11: 40 mg via SUBCUTANEOUS
  Filled 2016-08-11: qty 0.4

## 2016-08-11 MED ORDER — OXYCODONE HCL 5 MG PO TABS: 10.0000 mg | ORAL_TABLET | Freq: Three times a day (TID) | ORAL | Status: DC | PRN

## 2016-08-11 MED ORDER — STROKE: EARLY STAGES OF RECOVERY BOOK
Freq: Once | Status: DC
  Filled 2016-08-11: qty 1

## 2016-08-11 NOTE — Progress Notes (Signed)
Call time 1100 Beeper 1101 Exam started 1108 Exam finished 1114 Sent to [place]

## 2016-08-11 NOTE — ED Notes (Signed)
Patient began stuttering and became more anxious when told she was going to MRI. Husband states that Patient has claustrophobia. Patient was calm before being told of MRI and was able to speak a few words. Stuttering and tremors returned until given ativan and dilauded.

## 2016-08-11 NOTE — ED Triage Notes (Signed)
Pt comes in with husband who states wife has been having a headache for a while but today at 30 pt began to have trouble speaking. Pt is able to move her extremities. Pt unable to get words out. MD cleared pt for ct at 1056

## 2016-08-11 NOTE — ED Provider Notes (Signed)
[facility] DEPT Provider Note   CSN: [id number] Arrival date & time: 08/11/16  1049  By signing my name below, I, Higinio Plan, attest that this documentation has been prepared under the direction and in the presence of Milton Ferguson, MD . Electronically Signed: Higinio Plan, Scribe. 08/11/2016. 11:02 AM.  History   Chief Complaint Chief Complaint  Patient presents with  . Code Stroke   Patient complains of a headache today. And around 10:20 AM she started to have difficulty speaking   The history is provided by the spouse. No language interpreter was used.  Cerebrovascular Accident  This is a new problem. The current episode started less than 1 hour ago. The problem occurs constantly. The problem has not changed since onset.Associated symptoms include headaches.   LEVEL V CAVEAT and Limited ROS due to Inability to Speak   HPI Comments: Erica Simmons is a 45 y.o. female who presents to the Emergency Department for an evaluation of possible altered mental status that began at 10:20 AM this morning. Per nurse, pt's husband brought pt to the ED this morning due to difficulty speaking. Per husband, pt has had a headache "for a while" and is diaphoretic now in the ED. Pt has a hx of suspected pituitary tumor.   Past Medical History:  Diagnosis Date  . Elevated liver enzymes   . Fibromyalgia   . Hyperlipemia   . Prediabetes   . Restless leg syndrome   . Syncope     Patient Active Problem List   Diagnosis Date Noted  . Pituitary adenoma ([place]) 07/02/2016  . Respiratory acidosis 05/31/2016  . Restless leg syndrome 12/05/2015  . Pain in limb 12/05/2015  . Faintness   . Syncopal episodes 11/10/2015  . Tobacco abuse 11/10/2015  . Elevated liver enzymes 11/10/2015  . Bone pain 11/10/2015    Past Surgical History:  Procedure Laterality Date  . ABDOMINAL HYSTERECTOMY  2000   Total Hysterectomy for Endometriosis  . ULNAR NERVE REPAIR      OB History    No data available       Home Medications    Prior to Admission medications   Medication Sig Start Date End Date Taking? Authorizing Provider  ALPRAZolam Duanne Moron) 0.5 MG tablet Take 0.5 mg by mouth 2 (two) times daily as needed for anxiety. 05/21/16   Historical Provider, MD  dexamethasone (DECADRON) 1 MG tablet Take at 9-10 PM, the night before blood test 07/02/16   Renato Shin, MD  esomeprazole (NEXIUM) 40 MG capsule Take 40 mg by mouth daily at 12 noon.    Historical Provider, MD  Multiple Vitamins-Minerals (HM MULTIVITAMIN ADULT GUMMY PO) Take 1 tablet by mouth daily.    Historical Provider, MD  ondansetron (ZOFRAN) 4 MG tablet Take 1 tablet (4 mg total) by mouth every 8 (eight) hours as needed for nausea or vomiting. 04/20/16   Margette Fast, MD  Oxycodone HCl 10 MG TABS Take 1 tablet by mouth 3 (three) times daily as needed (for pain). Reported on 02/21/2016 01/28/16   Historical Provider, MD  oxyCODONE-acetaminophen (PERCOCET/ROXICET) 5-325 MG tablet Take 2 tablets by mouth every 4 (four) hours as needed for severe pain. Patient not taking: Reported on 07/02/2016 04/20/16   Margette Fast, MD  phentermine (ADIPEX-P) 37.5 MG tablet Take 1 tablet by mouth daily. 02/12/16   Historical Provider, MD  sucralfate (CARAFATE) 1 g tablet Take 1 tablet (1 g total) by mouth 4 (four) times daily -  with meals and at bedtime.  Patient not taking: Reported on 07/02/2016 04/20/16   Margette Fast, MD  tizanidine (ZANAFLEX) 2 MG capsule Take 1 capsule (2 mg total) by mouth 3 (three) times daily. Patient not taking: Reported on 07/02/2016 04/03/16   Marcial Pacas, MD    Family History Family History  Problem Relation Age of Onset  . Liver disease Mother   . Diabetes Mother   . Heart disease Maternal Grandmother   . Osteoporosis Maternal Grandmother   . Diabetes Maternal Grandfather   . Heart disease Paternal Grandfather   . Breast cancer Maternal Aunt   . Ovarian cancer Sister   . Headache Neg Hx     Social History Social History   Substance Use Topics  . Smoking status: Current Every Day Smoker    Packs/day: 0.50    Types: Cigarettes  . Smokeless tobacco: Never Used  . Alcohol use 0.0 oz/week     Comment: 1-2 times per month     Allergies   Lyrica [pregabalin]   Review of Systems Review of Systems  Unable to perform ROS: Patient nonverbal  Neurological: Positive for headaches.   Physical Exam Updated Vital Signs BP 158/85 (BP Location: Left Arm)   Pulse 66   Resp (!) 28   Ht 5\' 3"  (1.6 m)   Wt 155 lb (70.3 kg)   SpO2 100%   BMI 27.46 kg/m   Physical Exam  Constitutional: She appears well-developed.  HENT:  Head: Normocephalic.  Eyes: Conjunctivae and EOM are normal. No scleral icterus.  Neck: Neck supple. No thyromegaly present.  Cardiovascular: Normal rate and regular rhythm.  Exam reveals no gallop and no friction rub.   No murmur heard. Pulmonary/Chest: No stridor. She has no wheezes. She has no rales. She exhibits no tenderness.  Abdominal: She exhibits no distension. There is no tenderness. There is no rebound.  Musculoskeletal: Normal range of motion. She exhibits no edema.  Lymphadenopathy:    She has no cervical adenopathy.  Neurological: She is alert. She exhibits normal muscle tone. Coordination normal.  Pt is alert and can follow commands but cannot speak   Skin: No rash noted. No erythema.  Psychiatric: She has a normal mood and affect. Her behavior is normal.   ED Treatments / Results  Labs (all labs ordered are listed, but only abnormal results are displayed) Labs Reviewed - No data to display  EKG  EKG Interpretation None       Radiology Ct Head Wo Contrast  Result Date: 08/11/2016 CLINICAL DATA:  Code stroke, headache, difficulty speaking. EXAM: CT HEAD WITHOUT CONTRAST TECHNIQUE: Contiguous axial images were obtained from the base of the skull through the vertex without intravenous contrast. COMPARISON:  11/10/2015. FINDINGS: Brain: No evidence of an acute  infarct, acute hemorrhage, mass lesion, mass effect or hydrocephalus. Vascular: No hyperdense vessel or unexpected calcification. Skull: Normal. Negative for fracture or focal lesion. Sinuses/Orbits: No acute finding. Other: None. IMPRESSION: Negative. These results were called by telephone at the time of interpretation on 08/11/2016 at 11:24 am to Dr. Milton Ferguson , who verbally acknowledged these results. Electronically Signed   By: Lorin Picket M.D.   On: 08/11/2016 11:24    Procedures Procedures (including critical care time)  Medications Ordered in ED Medications - No data to display  DIAGNOSTIC STUDIES:  Oxygen Saturation is 100% on RA, normal by my interpretation.    COORDINATION OF CARE:  10:57 AM Discussed treatment plan with pt at bedside and pt agreed to plan.  Initial  Impression / Assessment and Plan / ED Course  I have reviewed the triage vital signs and the nursing notes.  Pertinent labs & imaging results that were available during my care of the patient were reviewed by me and considered in my medical decision making (see chart for details).  Clinical Course    Patient with headache and difficulty speaking. Patient was seen by neurology and it was felt the patient was not having a stroke and did not warrant TPA. MRI showed she did not have an acute stroke. Patient will be admitted for further workup including EEG  The chart was scribed for me under my direct supervision.  I personally performed the history, physical, and medical decision making and all procedures in the evaluation of this patient..    I personally performed the services described in this documentation, which was scribed in my presence. The recorded information has been reviewed and is accurate.   Final Clinical Impressions(s) / ED Diagnoses   Final diagnoses:  None    New Prescriptions New Prescriptions   No medications on file     Milton Ferguson, MD 08/11/16 1418

## 2016-08-11 NOTE — Consult Note (Signed)
Erica A. Merlene Laughter, MD     [email or web address]          Erica Simmons is an 45 y.o. female.   ASSESSMENT/PLAN:      1. This is a female multiple symptoms without a clear unifying diagnosis. Cervical spine imaging does show moderate to severe multilevel bilateral facet disease/hypertrophy which could explain the patient's headaches. The other symptoms such as confusion, word finding difficulties, shaking and other neurological symptoms however seemed to have no clear unifying diagnosis but could be due to complicated migraine headaches. Other differential diagnosis includes seizures and psychosomatic disorders.  2. Left intracranial stenosis involving the left distal PCA. Clinical significance is uncertain and I do not believe this is relevant at this time. I suspect this is asymptomatic.   Recommendation: 1. Discontinuation of all opioids as I think there is an element of transform migraine headaches/analgesic overuse headaches. 2. Start the patient on Topamax prophylaxis and titrate to at least 200 mg or until efficacies achieved. 3. Agree with EEG. 4. Trial of high-dose steroids for symptomatically relief. 5. Reglan to 10 mg IV around the clock which can help with abortive treatment. 6. Toradol for rate through pain. 7. Follow-up cardiac duplex Doppler. 8. Continue with aspirin.       The patient is a 45 year old female who presents with numerous complaints. She reports that her problems started in 2015 when she relocated from [place] to this area. She started having episodic headaches at that time. She also started having unusual symptoms that she is staring spells then blackout spells fallen to the ground. The husband also reports that she has had episodes of word finding with her headaches and related symptoms. She reports having neck pain. The headaches seem to be holocephalic but at times occurs in different locations. It appears her symptoms have  become progressively worse with time. She reports having a constant headache for the last several months. She was admitted to the [facility] a couple months ago and left AGAINST MEDICAL ADVICE. She did have a MRI scan which was unrevealing at that time. The scan was somewhat degraded by motion artifact. The patient presented today with again persistent headaches. The patient's husband reports that she had difficulties with word finding. The patient reports that she had a spell a couple days ago where she felt as if something popped in the center of her head. She subsequently has developed more left-sided headaches associated with tingling. The husband reported that she has become less responsive with crying spells and gibberish speech. I did see one of the events and they're characterized the patient being unresponsive crying and having gibberish speech. She apparently had this for 4 hours today. The spell was also associated with shaking of the left side mostly. It appears the patient has seen multiple physicians for this problem. She has had a cervical spine MRI the couple months ago which shows severe multilevel facet hypertrophy. She also apparently has a history of perennial cyst and possibly even pituitary microadenoma. She did see an endocrinologist per the family for this problem. Husband reports that her hormonal levels were normal however.  The patient has multiple other symptoms including chronic pain issues, probably restless leg syndrome and possibly neuropathic pain involving the upper extremities. Given her MRI these could be given due to cervical radiculopathy.    GENERAL: She appears to be in discomfort from pain but in no acute distress.  HEENT: Neck is supple. Head is normocephalic and atraumatic.  ABDOMEN:  soft  EXTREMITIES: No edema   BACK: Normal  SKIN: Normal by inspection.    MENTAL STATUS: Alert and oriented. Speech, language and cognition are generally intact.  Judgment and insight normal.   CRANIAL NERVES: Pupils are equal, round and reactive to light and accomodation; extra ocular movements are full, there is no significant nystagmus; visual fields are full; upper and lower facial muscles are normal in strength and symmetric, there is no flattening of the nasolabial folds; tongue is midline; uvula is midline; shoulder elevation is normal. Funduscopic examination shows flat discs with spontaneous venous pulsations.  MOTOR: Normal tone, bulk and strength; no pronator drift.  COORDINATION: Left finger to nose is normal, right finger to nose is normal, No rest tremor; no intention tremor; no postural tremor; no bradykinesia.  REFLEXES: Deep tendon reflexes are symmetrical and normal. Babinski reflexes are flexor bilaterally.   SENSATION: Reduced to light touch and temperature right lower extremity.    Blood pressure 109/65, pulse 77, temperature 98.4 F (36.9 C), temperature source Oral, resp. rate 17, height _0  (1.6 m), weight 155 lb (70.3 kg), SpO2 100 %.  Past Medical History:  Diagnosis Date  . Elevated liver enzymes   . Fibromyalgia   . Hyperlipemia   . Prediabetes   . Restless leg syndrome   . Syncope     Past Surgical History:  Procedure Laterality Date  . ABDOMINAL HYSTERECTOMY  2000   Total Hysterectomy for Endometriosis  . ULNAR NERVE REPAIR      Family History  Problem Relation Age of Onset  . Liver disease Mother   . Diabetes Mother   . Heart disease Maternal Grandmother   . Osteoporosis Maternal Grandmother   . Diabetes Maternal Grandfather   . Heart disease Paternal Grandfather   . Breast cancer Maternal Aunt   . Ovarian cancer Sister   . Headache Neg Hx     Social History:  reports that she has been smoking Cigarettes.  She has a 10.00 pack-year smoking history. She has never used smokeless tobacco. She reports that she drinks alcohol. She reports that she does not use drugs.  Allergies:  Allergies    Allergen Reactions  . Lyrica [Pregabalin]     Mood swings    Medications: Prior to Admission medications   Medication Sig Start Date End Date Taking? Authorizing Provider  ALPRAZolam Duanne Moron) 0.5 MG tablet Take 0.5 mg by mouth 2 (two) times daily as needed for anxiety. 05/21/16  Yes Historical Provider, MD  Multiple Vitamins-Minerals (HM MULTIVITAMIN ADULT GUMMY PO) Take 1 tablet by mouth daily.   Yes Historical Provider, MD  omeprazole (PRILOSEC) 40 MG capsule Take 40 mg by mouth daily.  07/15/16  Yes Historical Provider, MD  ondansetron (ZOFRAN) 4 MG tablet Take 1 tablet (4 mg total) by mouth every 8 (eight) hours as needed for nausea or vomiting. 04/20/16  Yes Margette Fast, MD  Oxycodone HCl 10 MG TABS Take 1 tablet by mouth 3 (three) times daily as needed (for pain). Reported on 02/21/2016 01/28/16  Yes Historical Provider, MD  oxyCODONE-acetaminophen (PERCOCET/ROXICET) 5-325 MG tablet Take 2 tablets by mouth every 4 (four) hours as needed for severe pain. Patient not taking: Reported on 08/11/2016 04/20/16   Margette Fast, MD    Scheduled Meds: .  stroke: mapping our early stages of recovery book   Does not apply Once  . aspirin  300 mg Rectal Daily   Or  . aspirin  325 mg Oral Daily  .  enoxaparin (LOVENOX) injection  40 mg Subcutaneous Q24H  . methylPREDNISolone (SOLU-MEDROL) injection  500 mg Intravenous Daily  . metoCLOPramide (REGLAN) injection  10 mg Intravenous Q6H  . nicotine  14 mg Transdermal Daily  . pantoprazole  40 mg Oral Daily  . topiramate  25 mg Oral BID   Continuous Infusions: . sodium chloride 1,000 mL (08/11/16 1818)   PRN Meds:.acetaminophen **OR** acetaminophen, ALPRAZolam, ketorolac, ondansetron, senna-docusate     Results for orders placed or performed during the hospital encounter of 08/11/16 (from the past 48 hour(s))  Urine rapid drug screen (hosp performed)not at [facility]     Status: Abnormal   Collection Time: 08/11/16 10:59 AM  Result Value Ref Range    Opiates NONE DETECTED NONE DETECTED   Cocaine NONE DETECTED NONE DETECTED   Benzodiazepines POSITIVE (A) NONE DETECTED   Amphetamines NONE DETECTED NONE DETECTED   Tetrahydrocannabinol NONE DETECTED NONE DETECTED   Barbiturates NONE DETECTED NONE DETECTED    Comment:        DRUG SCREEN FOR MEDICAL PURPOSES ONLY.  IF CONFIRMATION IS NEEDED FOR ANY PURPOSE, NOTIFY LAB WITHIN 5 DAYS.        LOWEST DETECTABLE LIMITS FOR URINE DRUG SCREEN Drug Class       Cutoff (ng/mL) Amphetamine      1000 Barbiturate      200 Benzodiazepine   409 Tricyclics       735 Opiates          300 Cocaine          300 THC              50   Urinalysis, Routine w reflex microscopic (not at [facility])     Status: Abnormal   Collection Time: 08/11/16 10:59 AM  Result Value Ref Range   Color, Urine STRAW (A) YELLOW   APPearance CLEAR CLEAR   Specific Gravity, Urine <1.005 (L) 1.005 - 1.030   pH 6.0 5.0 - 8.0   Glucose, UA NEGATIVE NEGATIVE mg/dL   Hgb urine dipstick NEGATIVE NEGATIVE   Bilirubin Urine NEGATIVE NEGATIVE   Ketones, ur NEGATIVE NEGATIVE mg/dL   Protein, ur NEGATIVE NEGATIVE mg/dL   Nitrite NEGATIVE NEGATIVE   Leukocytes, UA NEGATIVE NEGATIVE    Comment: MICROSCOPIC NOT DONE ON URINES WITH NEGATIVE PROTEIN, BLOOD, LEUKOCYTES, NITRITE, OR GLUCOSE <1000 mg/dL.  CBG monitoring, ED     Status: None   Collection Time: 08/11/16 11:01 AM  Result Value Ref Range   Glucose-Capillary 90 65 - 99 mg/dL  Ethanol     Status: None   Collection Time: 08/11/16 11:05 AM  Result Value Ref Range   Alcohol, Ethyl (B) <5 <5 mg/dL    Comment:        LOWEST DETECTABLE LIMIT FOR SERUM ALCOHOL IS 5 mg/dL FOR MEDICAL PURPOSES ONLY   Protime-INR     Status: None   Collection Time: 08/11/16 11:05 AM  Result Value Ref Range   Prothrombin Time 11.7 11.4 - 15.2 seconds   INR 0.86   APTT     Status: None   Collection Time: 08/11/16 11:05 AM  Result Value Ref Range   aPTT 25 24 - 36 seconds  CBC     Status: None    Collection Time: 08/11/16 11:05 AM  Result Value Ref Range   WBC 8.7 4.0 - 10.5 K/uL   RBC 4.54 3.87 - 5.11 MIL/uL   Hemoglobin 14.8 12.0 - 15.0 g/dL   HCT 43.7 36.0 - 46.0 %  MCV 96.3 78.0 - 100.0 fL   MCH 32.6 26.0 - 34.0 pg   MCHC 33.9 30.0 - 36.0 g/dL   RDW 13.9 11.5 - 15.5 %   Platelets 357 150 - 400 K/uL  Differential     Status: None   Collection Time: 08/11/16 11:05 AM  Result Value Ref Range   Neutrophils Relative % 44 %   Neutro Abs 3.9 1.7 - 7.7 K/uL   Lymphocytes Relative 47 %   Lymphs Abs 4.0 0.7 - 4.0 K/uL   Monocytes Relative 4 %   Monocytes Absolute 0.3 0.1 - 1.0 K/uL   Eosinophils Relative 5 %   Eosinophils Absolute 0.4 0.0 - 0.7 K/uL   Basophils Relative 0 %   Basophils Absolute 0.0 0.0 - 0.1 K/uL  Comprehensive metabolic panel     Status: Abnormal   Collection Time: 08/11/16 11:05 AM  Result Value Ref Range   Sodium 138 135 - 145 mmol/L   Potassium 4.1 3.5 - 5.1 mmol/L   Chloride 103 101 - 111 mmol/L   CO2 28 22 - 32 mmol/L   Glucose, Bld 100 (H) 65 - 99 mg/dL   BUN 14 6 - 20 mg/dL   Creatinine, Ser 0.80 0.44 - 1.00 mg/dL   Calcium 9.6 8.9 - 10.3 mg/dL   Total Protein 7.6 6.5 - 8.1 g/dL   Albumin 4.3 3.5 - 5.0 g/dL   AST 23 15 - 41 U/L   ALT 22 14 - 54 U/L   Alkaline Phosphatase 75 38 - 126 U/L   Total Bilirubin 0.3 0.3 - 1.2 mg/dL   GFR calc non Af Amer >60 >60 mL/min   GFR calc Af Amer >60 >60 mL/min    Comment: (NOTE) The eGFR has been calculated using the CKD EPI equation. This calculation has not been validated in all clinical situations. eGFR's persistently <60 mL/min signify possible Chronic Kidney Disease.    Anion gap 7 5 - 15  I-Stat Chem 8, ED  (not at [facility])     Status: Abnormal   Collection Time: 08/11/16 11:10 AM  Result Value Ref Range   Sodium 141 135 - 145 mmol/L   Potassium 4.2 3.5 - 5.1 mmol/L   Chloride 105 101 - 111 mmol/L   BUN 14 6 - 20 mg/dL   Creatinine, Ser 0.80 0.44 - 1.00 mg/dL   Glucose, Bld 98 65 - 99  mg/dL   Calcium, Ion 1.07 (L) 1.15 - 1.40 mmol/L   TCO2 27 0 - 100 mmol/L   Hemoglobin 15.3 (H) 12.0 - 15.0 g/dL   HCT 45.0 36.0 - 46.0 %  Troponin I     Status: None   Collection Time: 08/11/16 11:13 AM  Result Value Ref Range   Troponin I <0.03 <0.03 ng/mL    Studies/Results:  BRAIN MRA FINDINGS: Both vertebral arteries patent to the basilar without stenosis. PICA patent bilaterally. Basilar widely patent. Superior cerebellar arteries patent bilaterally. Moderate stenosis distal left posterior cerebral artery. Right posterior cerebral artery appears widely patent.  Cavernous carotid widely patent bilaterally without stenosis. Both anterior cerebral arteries appear normal. Right middle cerebral artery appears normal.  Left M1 segment patent. There is moderate disease in the left parietal branches of the left MCA with focal stenosis in two branches. The temporal branch is widely patent.  Negative for cerebral aneurysm.  IMPRESSION: Moderate stenosis distal left posterior cerebral artery. Moderate stenoses in the parietal branches of the left MCA.    BRAIN MRI FINDINGS:  Brain: Negative for acute infarct.  Ventricle size normal. Cerebral volume normal. No significant chronic ischemia. Negative for hemorrhage or fluid collection. Negative for mass or edema.  Vascular: Normal flow voids.  Skull and upper cervical spine: Normal marrow signal.  Sinuses/Orbits: Mucous retention cyst right maxillary sinus. Mild mucosal edema in the paranasal sinuses. Normal orbital structures.  Other: None  IMPRESSION: Negative MRI of the brain. No acute abnormality.        The brain MRI and MR are reviewed and person. The MRA does show a distal left MCA moderate stenosis of unclear clinical significance. The brain itself is normal with no white matter lesions, no Lacunar infarction no other lesions observed.        Olin Gurski A. Merlene Simmons, M.D.    Diplomate, Tax adviser of Psychiatry and Neurology ( Neurology). 08/11/2016, 8:40 PM

## 2016-08-11 NOTE — H&P (Signed)
History and Physical    Erica Simmons [id number] DOB: 07/01/1971 DOA: 08/11/2016  PCP: Wende Neighbors, MD  Patient coming from: home  Chief Complaint: difficulty speaking  HPI: Erica Simmons is a 45 y.o. female with medical history significant of fibromyalgia, GERD, was in her usual state of health this morning when at approximately 10 AM her husband noticed that she was having difficulty speaking. He reports that she was able to comprehend what was being said to her, but was unable to express her words. She was able to write down what she wanted to say. Transiently, she did not recognize her husband. She reports having a significant headache which has been present intermittently for the past several weeks. She has associated dizziness when headache is present. She also has paresthesias on the left side of her head. She reports that during her headache, she is unable to see out of her peripheral field of vision bilaterally  ED Course: She was brought to the ER for evaluation where CT of head was negative. She was evaluated by tele-neurology who did not feel that she was a candidate for TPA. They did recommend admission and further workup. MRI/MRA of the brain were done in the emergency room that ruled out acute infarct. During the course of her ER stay, her speech did begin to improve. His symptoms lasted total of approximately 4 hours before they begin to improve. She's been referred for admission.  Review of Systems: As per HPI otherwise 10 point review of systems negative.    Past Medical History:  Diagnosis Date  . Elevated liver enzymes   . Fibromyalgia   . Hyperlipemia   . Prediabetes   . Restless leg syndrome   . Syncope     Past Surgical History:  Procedure Laterality Date  . ABDOMINAL HYSTERECTOMY  2000   Total Hysterectomy for Endometriosis  . ULNAR NERVE REPAIR       reports that she has been smoking Cigarettes.  She has a 10.00 pack-year smoking history. She has never  used smokeless tobacco. She reports that she drinks alcohol. She reports that she does not use drugs.  Allergies  Allergen Reactions  . Lyrica [Pregabalin]     Mood swings    Family History  Problem Relation Age of Onset  . Liver disease Mother   . Diabetes Mother   . Heart disease Maternal Grandmother   . Osteoporosis Maternal Grandmother   . Diabetes Maternal Grandfather   . Heart disease Paternal Grandfather   . Breast cancer Maternal Aunt   . Ovarian cancer Sister   . Headache Neg Hx      Prior to Admission medications   Medication Sig Start Date End Date Taking? Authorizing Provider  ALPRAZolam Duanne Moron) 0.5 MG tablet Take 0.5 mg by mouth 2 (two) times daily as needed for anxiety. 05/21/16  Yes Historical Provider, MD  Multiple Vitamins-Minerals (HM MULTIVITAMIN ADULT GUMMY PO) Take 1 tablet by mouth daily.   Yes Historical Provider, MD  omeprazole (PRILOSEC) 40 MG capsule Take 40 mg by mouth daily.  07/15/16  Yes Historical Provider, MD  ondansetron (ZOFRAN) 4 MG tablet Take 1 tablet (4 mg total) by mouth every 8 (eight) hours as needed for nausea or vomiting. 04/20/16  Yes Margette Fast, MD  Oxycodone HCl 10 MG TABS Take 1 tablet by mouth 3 (three) times daily as needed (for pain). Reported on 02/21/2016 01/28/16  Yes Historical Provider, MD  oxyCODONE-acetaminophen (PERCOCET/ROXICET) 5-325 MG tablet Take 2 tablets by  mouth every 4 (four) hours as needed for severe pain. Patient not taking: Reported on 08/11/2016 04/20/16   Margette Fast, MD    Physical Exam: Vitals:   08/11/16 1200 08/11/16 1345 08/11/16 1400 08/11/16 1600  BP: 125/85  112/81 113/70  Pulse: 65 (!) 56 62 61  Resp: 15 22 20 18   Temp:    98.4 F (36.9 C)  SpO2: 97% 92% 90% 98%  Weight:      Height:          Constitutional: NAD, calm, comfortable Vitals:   08/11/16 1200 08/11/16 1345 08/11/16 1400 08/11/16 1600  BP: 125/85  112/81 113/70  Pulse: 65 (!) 56 62 61  Resp: 15 22 20 18   Temp:    98.4 F  (36.9 C)  SpO2: 97% 92% 90% 98%  Weight:      Height:       Eyes: PERRL, lids and conjunctivae normal ENMT: Mucous membranes are moist. Posterior pharynx clear of any exudate or lesions.Normal dentition.  Neck: normal, supple, no masses, no thyromegaly Respiratory: clear to auscultation bilaterally, no wheezing, no crackles. Normal respiratory effort. No accessory muscle use.  Cardiovascular: Regular rate and rhythm, no murmurs / rubs / gallops. No extremity edema. 2+ pedal pulses. No carotid bruits.  Abdomen: no tenderness, no masses palpated. No hepatosplenomegaly. Bowel sounds positive.  Musculoskeletal: no clubbing / cyanosis. No joint deformity upper and lower extremities. Good ROM, no contractures. Normal muscle tone.  Skin: no rashes, lesions, ulcers. No induration Neurologic: CN 2-12 grossly intact. Sensation intact, DTR normal. Strength 4/5 in bilateral extremities, slightly weaker on left side  Psychiatric: Normal judgment and insight. Alert and oriented x 3. Normal mood.     Labs on Admission: I have personally reviewed following labs and imaging studies  CBC:  Recent Labs Lab 08/11/16 1105 08/11/16 1110  WBC 8.7  --   NEUTROABS 3.9  --   HGB 14.8 15.3*  HCT 43.7 45.0  MCV 96.3  --   PLT 357  --    Basic Metabolic Panel:  Recent Labs Lab 08/11/16 1105 08/11/16 1110  NA 138 141  K 4.1 4.2  CL 103 105  CO2 28  --   GLUCOSE 100* 98  BUN 14 14  CREATININE 0.80 0.80  CALCIUM 9.6  --    GFR: Estimated Creatinine Clearance: 85.3 mL/min (by C-G formula based on SCr of 0.8 mg/dL). Liver Function Tests:  Recent Labs Lab 08/11/16 1105  AST 23  ALT 22  ALKPHOS 75  BILITOT 0.3  PROT 7.6  ALBUMIN 4.3   No results for input(s): LIPASE, AMYLASE in the last 168 hours. No results for input(s): AMMONIA in the last 168 hours. Coagulation Profile:  Recent Labs Lab 08/11/16 1105  INR 0.86   Cardiac Enzymes:  Recent Labs Lab 08/11/16 1113  TROPONINI  <0.03   BNP (last 3 results) No results for input(s): PROBNP in the last 8760 hours. HbA1C: No results for input(s): HGBA1C in the last 72 hours. CBG:  Recent Labs Lab 08/11/16 1101  GLUCAP 90   Lipid Profile: No results for input(s): CHOL, HDL, LDLCALC, TRIG, CHOLHDL, LDLDIRECT in the last 72 hours. Thyroid Function Tests: No results for input(s): TSH, T4TOTAL, FREET4, T3FREE, THYROIDAB in the last 72 hours. Anemia Panel: No results for input(s): VITAMINB12, FOLATE, FERRITIN, TIBC, IRON, RETICCTPCT in the last 72 hours. Urine analysis:    Component Value Date/Time   COLORURINE STRAW (A) 08/11/2016 1059   APPEARANCEUR CLEAR 08/11/2016  1059   LABSPEC <1.005 (L) 08/11/2016 1059   PHURINE 6.0 08/11/2016 1059   GLUCOSEU NEGATIVE 08/11/2016 1059   HGBUR NEGATIVE 08/11/2016 1059   BILIRUBINUR NEGATIVE 08/11/2016 1059   KETONESUR NEGATIVE 08/11/2016 1059   PROTEINUR NEGATIVE 08/11/2016 1059   UROBILINOGEN 0.2 12/11/2014 1113   NITRITE NEGATIVE 08/11/2016 1059   LEUKOCYTESUR NEGATIVE 08/11/2016 1059   Sepsis Labs: !!!!!!!!!!!!!!!!!!!!!!!!!!!!!!!!!!!!!!!!!!!! @LABRCNTIP (procalcitonin:4,lacticidven:4) )No results found for this or any previous visit (from the past 240 hour(s)).   Radiological Exams on Admission: Ct Head Wo Contrast  Result Date: 08/11/2016 CLINICAL DATA:  Code stroke, headache, difficulty speaking. EXAM: CT HEAD WITHOUT CONTRAST TECHNIQUE: Contiguous axial images were obtained from the base of the skull through the vertex without intravenous contrast. COMPARISON:  11/10/2015. FINDINGS: Brain: No evidence of an acute infarct, acute hemorrhage, mass lesion, mass effect or hydrocephalus. Vascular: No hyperdense vessel or unexpected calcification. Skull: Normal. Negative for fracture or focal lesion. Sinuses/Orbits: No acute finding. Other: None. IMPRESSION: Negative. These results were called by telephone at the time of interpretation on 08/11/2016 at 11:24 am to Dr.  Milton Ferguson , who verbally acknowledged these results. Electronically Signed   By: Lorin Picket M.D.   On: 08/11/2016 11:24   Mr Angiogram Head Wo Contrast  Result Date: 08/11/2016 CLINICAL DATA:  Stroke EXAM: MRA HEAD WITHOUT CONTRAST TECHNIQUE: Angiographic images of the Circle of Willis were obtained using MRA technique without intravenous contrast. COMPARISON:  MRI head 08/11/2016 FINDINGS: Both vertebral arteries patent to the basilar without stenosis. PICA patent bilaterally. Basilar widely patent. Superior cerebellar arteries patent bilaterally. Moderate stenosis distal left posterior cerebral artery. Right posterior cerebral artery appears widely patent. Cavernous carotid widely patent bilaterally without stenosis. Both anterior cerebral arteries appear normal. Right middle cerebral artery appears normal. Left M1 segment patent. There is moderate disease in the left parietal branches of the left MCA with focal stenosis in two branches. The temporal branch is widely patent. Negative for cerebral aneurysm. IMPRESSION: Moderate stenosis distal left posterior cerebral artery. Moderate stenoses in the parietal branches of the left MCA. Electronically Signed   By: Franchot Gallo M.D.   On: 08/11/2016 13:12   Mr Brain Wo Contrast  Result Date: 08/11/2016 CLINICAL DATA:  Slurred speech with right-sided weakness.  Stroke EXAM: MRI HEAD WITHOUT CONTRAST TECHNIQUE: Multiplanar, multiecho pulse sequences of the brain and surrounding structures were obtained without intravenous contrast. COMPARISON:  CT 08/11/2016 FINDINGS: Brain: Negative for acute infarct. Ventricle size normal. Cerebral volume normal. No significant chronic ischemia. Negative for hemorrhage or fluid collection. Negative for mass or edema. Vascular: Normal flow voids. Skull and upper cervical spine: Normal marrow signal. Sinuses/Orbits: Mucous retention cyst right maxillary sinus. Mild mucosal edema in the paranasal sinuses. Normal orbital  structures. Other: None IMPRESSION: Negative MRI of the brain.  No acute abnormality. Electronically Signed   By: Franchot Gallo M.D.   On: 08/11/2016 13:08    EKG: Independently reviewed. T wave inv in anterior leads (chronic)  Assessment/Plan Active Problems:   Tobacco abuse   Pituitary adenoma (HCC)   Slurred speech   Expressive aphasia   GERD (gastroesophageal reflux disease)    1. Expressive aphasia. Etiology is unclear. MRI of the brain is negative for acute infarct. Will order EEG. ? if this is related to headache. Will request neurology input. Further workup with carotid Dopplers and echocardiogram. Will order urine drug screen. She does not report being started on any new medications.  2. GERD. Continue PPI.  3. Tobacco  use. Nicotine patch provided.  4. History of pituitary adenoma. Followed as an outpatient.   DVT prophylaxis: lovenox Code Status: full Family Communication: discussed with husband Disposition Plan: discharge home once improved Consults called: neurology Admission status: observation/telemetry   Jaylin Benzel MD [facility] Pager [phone]  If 7PM-7AM, please contact night-coverage [email or web address] Password [email or web address]  08/11/2016, 5:20 PM

## 2016-08-11 NOTE — ED Notes (Signed)
Tele Neuro doctor states patient is not a candidate for TPA. Neuro checks every hour.

## 2016-08-12 ENCOUNTER — Observation Stay (HOSPITAL_COMMUNITY)
Admit: 2016-08-12 | Discharge: 2016-08-12 | Disposition: A | Discharge status: Home / Self Care | Payer: 59 | Attending: Internal Medicine | Admitting: Internal Medicine

## 2016-08-12 ENCOUNTER — Observation Stay (HOSPITAL_COMMUNITY): Payer: 59

## 2016-08-12 ENCOUNTER — Observation Stay (HOSPITAL_BASED_OUTPATIENT_CLINIC_OR_DEPARTMENT_OTHER): Payer: 59

## 2016-08-12 DIAGNOSIS — R4701 Aphasia: Secondary | ICD-10-CM

## 2016-08-12 DIAGNOSIS — D352 Benign neoplasm of pituitary gland: Secondary | ICD-10-CM | POA: Diagnosis not present

## 2016-08-12 DIAGNOSIS — I6350 Cerebral infarction due to unspecified occlusion or stenosis of unspecified cerebral artery: Secondary | ICD-10-CM

## 2016-08-12 DIAGNOSIS — Z720 Tobacco use: Secondary | ICD-10-CM | POA: Diagnosis not present

## 2016-08-12 DIAGNOSIS — K219 Gastro-esophageal reflux disease without esophagitis: Secondary | ICD-10-CM | POA: Diagnosis not present

## 2016-08-12 DIAGNOSIS — R4781 Slurred speech: Secondary | ICD-10-CM | POA: Diagnosis not present

## 2016-08-12 LAB — LIPID PANEL
CHOL/HDL RATIO: 3.7 ratio
CHOLESTEROL: 235 mg/dL — AB (ref 0–200)
HDL: 63 mg/dL (ref >40)
LDL CALC: 151 mg/dL — AB (ref 0–99)
TRIGLYCERIDES: 104 mg/dL (ref <150)
VLDL: 21 mg/dL (ref 0–40)

## 2016-08-12 LAB — ECHOCARDIOGRAM COMPLETE
AO mean calculated velocity dopler: 107 cm/s
AOPV: 0.63 m/s
AOVTI: 35 cm
AV Area VTI: 1.6 cm2
AV Area mean vel: 1.93 cm2
AV Mean grad: 6 mmHg
AV VEL mean LVOT/AV: 0.76
AV area mean vel ind: 1.08 cm2/m2
AV peak Index: 0.9
AVA: 1.74 cm2
AVAREAVTIIND: 0.97 cm2/m2
AVLVOTPG: 5 mmHg
AVPG: 14 mmHg
AVPHT: 565 ms
AVPKVEL: 184 cm/s
CHL CUP AV VALUE AREA INDEX: 0.97
CHL CUP AV VEL: 1.74
E decel time: 325 msec
E/e' ratio: 9.47
FS: 42 % (ref 28–44)
Height: 63 in
IVS/LV PW RATIO, ED: 1.05
LA ID, A-P, ES: 35 mm
LA diam end sys: 35 mm
LA diam index: 1.96 cm/m2
LAVOL: 33.9 mL
LAVOLA4C: 37.1 mL
LAVOLIN: 19 mL/m2
LV TDI E'LATERAL: 7.94
LV TDI E'MEDIAL: 8.38
LV dias vol index: 30 mL/m2
LVDIAVOL: 54 mL (ref 46–106)
LVEEAVG: 9.47
LVEEMED: 9.47
LVELAT: 7.94 cm/s
LVOT SV: 61 mL
LVOT VTI: 24 cm
LVOT area: 2.54 cm2
LVOT diameter: 18 mm
LVOT peak vel: 116 cm/s
LVOTVTI: 0.69 cm
LVSYSVOL: 19 mL (ref 14–42)
LVSYSVOLIN: 11 mL/m2
MV Dec: 325
MV pk A vel: 98.2 m/s
MV pk E vel: 75.2 m/s
MVPG: 2 mmHg
PW: 8.61 mm — AB (ref 0.6–1.1)
RV LATERAL S' VELOCITY: 14.7 cm/s
Simpson's disk: 65
Stroke v: 35 ml
TAPSE: 25 mm
Weight: 2480 oz

## 2016-08-12 MED ORDER — MORPHINE SULFATE (PF) 2 MG/ML IV SOLN
2.0000 mg | INTRAVENOUS | Status: DC | PRN
  Administered 2016-08-12 (×2): 2 mg via INTRAVENOUS
  Filled 2016-08-12 (×2): qty 1

## 2016-08-12 MED ORDER — TOPIRAMATE 25 MG PO TABS: 25.0000 mg | ORAL_TABLET | Freq: Two times a day (BID) | ORAL | 0 refills | Status: DC

## 2016-08-12 MED ORDER — ASPIRIN 300 MG RE SUPP: 300.0000 mg | Freq: Every day | RECTAL | 0 refills | Status: DC

## 2016-08-12 NOTE — Progress Notes (Signed)
[place] A. Merlene Laughter, MD     [email or web address]          Erica Simmons is an 45 y.o. female.   Assessment/Plan: 1. This is a female multiple symptoms without a clear unifying diagnosis. Cervical spine imaging does show moderate to severe multilevel bilateral facet disease/hypertrophy which could explain the patient's headaches. The other symptoms such as confusion, word finding difficulties, shaking and other neurological symptoms however seemed to have no clear unifying diagnosis but could be due to complicated migraine headaches. Other differential diagnosis includes seizures and psychosomatic disorders.  2. Left intracranial stenosis involving the left distal PCA. Clinical significance is uncertain and I do not believe this is relevant at this time. I suspect this is asymptomatic.   Recommendation: 1. Discontinuation of all opioids as I think there is an element of transform migraine headaches/analgesic overuse headaches. 2. Start the patient on Topamax prophylaxis and titrate to at least 200 mg or until efficacies achieved. 3.  Continue with aspirin. 4. NO OPIODS. 5. PRN Motrin 831m 6. FU 2 weeks     Feeling better but still w HA 7/10. Other symptoms resolved.       GENERAL: She appears to be in discomfort from pain but in no acute distress.  HEENT: Neck is supple. Head is normocephalic and atraumatic.  ABDOMEN: soft  EXTREMITIES: No edema   BACK: Normal  SKIN: Normal by inspection.    MENTAL STATUS: Alert and oriented. Speech, language and cognition are generally intact. Judgment and insight normal.   CRANIAL NERVES: Pupils are equal, round and reactive to light and accomodation; extra ocular movements are full, there is no significant nystagmus; visual fields are full; upper and lower facial muscles are normal in strength and symmetric, there is no flattening of the nasolabial folds; tongue is midline; uvula is midline;  shoulder elevation is normal. Funduscopic examination shows flat discs with spontaneous venous pulsations.  MOTOR: Normal tone, bulk and strength; no pronator drift.  COORDINATION: Left finger to nose is normal, right finger to nose is normal, No rest tremor; no intention tremor; no postural tremor; no bradykinesia.      Objective: Vital signs in last 24 hours: Temp:  [97.3 F (36.3 C)-98.4 F (36.9 C)] 98.1 F (36.7 C) (11/07 1600) Pulse Rate:  [59-81] 81 (11/07 1600) Resp:  [16-20] 20 (11/07 1600) BP: (103-123)/(54-71) 120/71 (11/07 1600) SpO2:  [96 %-100 %] 99 % (11/07 1600)  Intake/Output from previous day: 11/06 0701 - 11/07 0700 In: 946.5 [P.O.:240; I.V.:652.5; IV Piggyback:54] Out: -  Intake/Output this shift: No intake/output data recorded. Nutritional status: Diet Heart Room service appropriate? Yes; Fluid consistency: Thin   Lab Results: Results for orders placed or performed during the hospital encounter of 08/11/16 (from the past 48 hour(s))  [facility] drug [facility]hosp [facility]    Status: Abnormal   Collection Time: 08/11/16 10:59 AM  Result Value Ref Range   Opiates NONE DETECTED NONE DETECTED   Cocaine NONE DETECTED NONE DETECTED   Benzodiazepines POSITIVE (A) NONE DETECTED   Amphetamines NONE DETECTED NONE DETECTED   Tetrahydrocannabinol NONE DETECTED NONE DETECTED   Barbiturates NONE DETECTED NONE DETECTED    Comment:        DRUG SCREEN FOR MEDICAL PURPOSES ONLY.  IF CONFIRMATION IS NEEDED FOR ANY PURPOSE, NOTIFY LAB WITHIN 5 DAYS.        LOWEST DETECTABLE LIMITS FOR URINE DRUG SCREEN Drug Class       Cutoff (ng/mL) Amphetamine  1000 Barbiturate      200 Benzodiazepine   256 Tricyclics       389 Opiates          300 Cocaine          300 THC              50   Urinalysis, Routine w reflex microscopic (not at [facility])     Status: Abnormal   Collection Time: 08/11/16 10:59 AM  Result Value Ref Range   Color, Urine STRAW (A)  YELLOW   APPearance CLEAR CLEAR   Specific Gravity, Urine <1.005 (L) 1.005 - 1.030   pH 6.0 5.0 - 8.0   Glucose, UA NEGATIVE NEGATIVE mg/dL   Hgb urine dipstick NEGATIVE NEGATIVE   Bilirubin Urine NEGATIVE NEGATIVE   Ketones, ur NEGATIVE NEGATIVE mg/dL   Protein, ur NEGATIVE NEGATIVE mg/dL   Nitrite NEGATIVE NEGATIVE   Leukocytes, UA NEGATIVE NEGATIVE    Comment: MICROSCOPIC NOT DONE ON URINES WITH NEGATIVE PROTEIN, BLOOD, LEUKOCYTES, NITRITE, OR GLUCOSE <1000 mg/dL.  CBG monitoring, ED     Status: None   Collection Time: 08/11/16 11:01 AM  Result Value Ref Range   Glucose-Capillary 90 65 - 99 mg/dL  Ethanol     Status: None   Collection Time: 08/11/16 11:05 AM  Result Value Ref Range   Alcohol, Ethyl (B) <5 <5 mg/dL    Comment:        LOWEST DETECTABLE LIMIT FOR SERUM ALCOHOL IS 5 mg/dL FOR MEDICAL PURPOSES ONLY   Protime-INR     Status: None   Collection Time: 08/11/16 11:05 AM  Result Value Ref Range   Prothrombin Time 11.7 11.4 - 15.2 seconds   INR 0.86   APTT     Status: None   Collection Time: 08/11/16 11:05 AM  Result Value Ref Range   aPTT 25 24 - 36 seconds  CBC     Status: None   Collection Time: 08/11/16 11:05 AM  Result Value Ref Range   WBC 8.7 4.0 - 10.5 K/uL   RBC 4.54 3.87 - 5.11 MIL/uL   Hemoglobin 14.8 12.0 - 15.0 g/dL   HCT 43.7 36.0 - 46.0 %   MCV 96.3 78.0 - 100.0 fL   MCH 32.6 26.0 - 34.0 pg   MCHC 33.9 30.0 - 36.0 g/dL   RDW 13.9 11.5 - 15.5 %   Platelets 357 150 - 400 K/uL  Differential     Status: None   Collection Time: 08/11/16 11:05 AM  Result Value Ref Range   Neutrophils Relative % 44 %   Neutro Abs 3.9 1.7 - 7.7 K/uL   Lymphocytes Relative 47 %   Lymphs Abs 4.0 0.7 - 4.0 K/uL   Monocytes Relative 4 %   Monocytes Absolute 0.3 0.1 - 1.0 K/uL   Eosinophils Relative 5 %   Eosinophils Absolute 0.4 0.0 - 0.7 K/uL   Basophils Relative 0 %   Basophils Absolute 0.0 0.0 - 0.1 K/uL  Comprehensive metabolic panel     Status: Abnormal    Collection Time: 08/11/16 11:05 AM  Result Value Ref Range   Sodium 138 135 - 145 mmol/L   Potassium 4.1 3.5 - 5.1 mmol/L   Chloride 103 101 - 111 mmol/L   CO2 28 22 - 32 mmol/L   Glucose, Bld 100 (H) 65 - 99 mg/dL   BUN 14 6 - 20 mg/dL   Creatinine, Ser 0.80 0.44 - 1.00 mg/dL   Calcium 9.6 8.9 -  10.3 mg/dL   Total Protein 7.6 6.5 - 8.1 g/dL   Albumin 4.3 3.5 - 5.0 g/dL   AST 23 15 - 41 U/L   ALT 22 14 - 54 U/L   Alkaline Phosphatase 75 38 - 126 U/L   Total Bilirubin 0.3 0.3 - 1.2 mg/dL   GFR calc non Af Amer >60 >60 mL/min   GFR calc Af Amer >60 >60 mL/min    Comment: (NOTE) The eGFR has been calculated using the CKD EPI equation. This calculation has not been validated in all clinical situations. eGFR's persistently <60 mL/min signify possible Chronic Kidney Disease.    Anion gap 7 5 - 15  I-Stat Chem 8, ED  (not at [facility])     Status: Abnormal   Collection Time: 08/11/16 11:10 AM  Result Value Ref Range   Sodium 141 135 - 145 mmol/L   Potassium 4.2 3.5 - 5.1 mmol/L   Chloride 105 101 - 111 mmol/L   BUN 14 6 - 20 mg/dL   Creatinine, Ser 0.80 0.44 - 1.00 mg/dL   Glucose, Bld 98 65 - 99 mg/dL   Calcium, Ion 1.07 (L) 1.15 - 1.40 mmol/L   TCO2 27 0 - 100 mmol/L   Hemoglobin 15.3 (H) 12.0 - 15.0 g/dL   HCT 45.0 36.0 - 46.0 %  Troponin I     Status: None   Collection Time: 08/11/16 11:13 AM  Result Value Ref Range   Troponin I <0.03 <0.03 ng/mL  Urine rapid drug screen (hosp performed)not at [facility]     Status: Abnormal   Collection Time: 08/11/16  4:36 PM  Result Value Ref Range   Opiates NONE DETECTED NONE DETECTED   Cocaine NONE DETECTED NONE DETECTED   Benzodiazepines POSITIVE (A) NONE DETECTED   Amphetamines NONE DETECTED NONE DETECTED   Tetrahydrocannabinol NONE DETECTED NONE DETECTED   Barbiturates NONE DETECTED NONE DETECTED    Comment:        DRUG SCREEN FOR MEDICAL PURPOSES ONLY.  IF CONFIRMATION IS NEEDED FOR ANY PURPOSE, NOTIFY LAB WITHIN 5 DAYS.          LOWEST DETECTABLE LIMITS FOR URINE DRUG SCREEN Drug Class       Cutoff (ng/mL) Amphetamine      1000 Barbiturate      200 Benzodiazepine   676 Tricyclics       720 Opiates          300 Cocaine          300 THC              50   Lipid panel     Status: Abnormal   Collection Time: 08/12/16  6:19 AM  Result Value Ref Range   Cholesterol 235 (H) 0 - 200 mg/dL   Triglycerides 104 <150 mg/dL   HDL 63 >40 mg/dL   Total CHOL/HDL Ratio 3.7 RATIO   VLDL 21 0 - 40 mg/dL   LDL Cholesterol 151 (H) 0 - 99 mg/dL    Comment:        Total Cholesterol/HDL:CHD Risk Coronary Heart Disease Risk Table                     Men   Women  1/2 Average Risk   3.4   3.3  Average Risk       5.0   4.4  2 X Average Risk   9.6   7.1  3 X Average Risk  23.4  11.0        Use the calculated Patient Ratio above and the CHD Risk Table to determine the patient's CHD Risk.        ATP III CLASSIFICATION (LDL):  <100     mg/dL   Optimal  100-129  mg/dL   Near or Above                    Optimal  130-159  mg/dL   Borderline  160-189  mg/dL   High  >190     mg/dL   Very High     Lipid Panel  Recent Labs  08/12/16 0619  CHOL 235*  TRIG 104  HDL 63  CHOLHDL 3.7  VLDL 21  LDLCALC 151*    Studies/Results:   CAROTID DOPPLERS IMPRESSION: 1. No significant carotid atherosclerotic vascular disease.  2. Vertebral arteries are patent antegrade flow.  3.  An arrhythmia cannot be excluded .    TTE - Left ventricle: The cavity size was normal. Systolic function was   normal. The estimated ejection fraction was in the range of 60%   to 65%. Wall motion was normal; there were no regional wall   motion abnormalities. - Aortic valve: There was mild regurgitation. Valve area (VTI):   1.74 cm^2. Valve area (Vmax): 1.6 cm^2. Valve area (Vmean): 1.93   cm^2. - Atrial septum: No defect or patent foramen ovale was identified.  Impressions:  - No cardiac source of emboli was  indentified.        Medications:  Scheduled Meds: .  stroke: mapping our early stages of recovery book   Does not apply Once  . aspirin  300 mg Rectal Daily   Or  . aspirin  325 mg Oral Daily  . enoxaparin (LOVENOX) injection  40 mg Subcutaneous Q24H  . methylPREDNISolone (SOLU-MEDROL) injection  500 mg Intravenous Q24H  . metoCLOPramide (REGLAN) injection  10 mg Intravenous Q6H  . nicotine  14 mg Transdermal Daily  . pantoprazole  40 mg Oral Daily  . topiramate  25 mg Oral BID   Continuous Infusions: . sodium chloride 1,000 mL (08/11/16 1818)   PRN Meds:.acetaminophen **OR** acetaminophen, ALPRAZolam, ketorolac, morphine injection, ondansetron, senna-docusate     LOS: 0 days   Mame Twomb[place] A. Merlene Laughter, M.D.  Diplomate, Tax adviser of Psychiatry and Neurology ( Neurology).

## 2016-08-12 NOTE — Progress Notes (Signed)
EEG Completed; Results Pending  

## 2016-08-12 NOTE — Discharge Summary (Signed)
Physician Discharge Summary  Erica Simmons [id number] DOB: [birth year] DOA: 08/11/2016  PCP: Wende Neighbors, MD  Admit date: 08/11/2016 Discharge date: 08/12/2016  Admitted From: Home Disposition:  Home, self-care  Recommendations for Outpatient Follow-up:  1. Follow up with PCP in 1-2 weeks; follow-up as scheduled with neurology  Home Health: No Equipment/Devices: None    Discharge Condition: Stable  CODE STATUS: Full   Diet recommendation:  Regular   Brief/Interim Summary: Erica Simmons is a 58 yof with a hx of fibromyalgia and GERD, presented with speech difficulty, in which she had to interpret what she was saying by writing on paper. She also reported a headache in which she could not see out of her peripheral field of vision bilaterally. While in the ED, stroke workup was negative and her serology was unremarkable except for urine screen that was positive for benzodiazepines. She was admitted for further evaluation of expressive aphasia.   Hospital Course:  Etiology of the patient's expressive aphasia is not completley clear. MRI of the brain is negative for acute infarct. MRA head shows moderate stenosis distal left posterior cerebral artery. An EEG, echocardiogram, and carotid dropplers have been ordered. Urine drug screen was positive for benzodiazepine. Neurology was consulted and felt that symptoms may be secondary to headache.  Recommendations were for discontinuing opioids and starting the patient on Topamax, continuing aspirin. The patient made good improvement during her stay, including resolution of aphasia. She was discharged in much-improved and stable condition.   Discharge Diagnoses:  Active Problems:   Tobacco abuse   Pituitary adenoma ([place])   Slurred speech   Expressive aphasia   GERD (gastroesophageal reflux disease)  Discharge Instructions Resume normal activity and diet as tolerated.     Medication List    STOP taking these medications   Oxycodone HCl 10  MG Tabs   oxyCODONE-acetaminophen 5-325 MG tablet Commonly known as:  PERCOCET/ROXICET     TAKE these medications   ALPRAZolam 0.5 MG tablet Commonly known as:  XANAX Take 0.5 mg by mouth 2 (two) times daily as needed for anxiety.   aspirin 300 MG suppository Place 1 suppository (300 mg total) rectally daily. Start taking on:  08/13/2016   HM MULTIVITAMIN ADULT GUMMY PO Take 1 tablet by mouth daily.   omeprazole 40 MG capsule Commonly known as:  PRILOSEC Take 40 mg by mouth daily.   ondansetron 4 MG tablet Commonly known as:  ZOFRAN Take 1 tablet (4 mg total) by mouth every 8 (eight) hours as needed for nausea or vomiting.   topiramate 25 MG tablet Commonly known as:  TOPAMAX Take 1 tablet (25 mg total) by mouth 2 (two) times daily.       Allergies  Allergen Reactions  . Lyrica [Pregabalin]     Mood swings    Consultations: - Dr. Merlene Laughter of neurology kindly consulted on the case.    Procedures/Studies: Ct Head Wo Contrast  Result Date: 08/11/2016 CLINICAL DATA:  Code stroke, headache, difficulty speaking. EXAM: CT HEAD WITHOUT CONTRAST TECHNIQUE: Contiguous axial images were obtained from the base of the skull through the vertex without intravenous contrast. COMPARISON:  11/10/2015. FINDINGS: Brain: No evidence of an acute infarct, acute hemorrhage, mass lesion, mass effect or hydrocephalus. Vascular: No hyperdense vessel or unexpected calcification. Skull: Normal. Negative for fracture or focal lesion. Sinuses/Orbits: No acute finding. Other: None. IMPRESSION: Negative. These results were called by telephone at the time of interpretation on 08/11/2016 at 11:24 am to Dr. Milton Ferguson , who verbally acknowledged  these results. Electronically Signed   By: Lorin Picket M.D.   On: 08/11/2016 11:24   Mr Angiogram Head Wo Contrast  Result Date: 08/11/2016 CLINICAL DATA:  Stroke EXAM: MRA HEAD WITHOUT CONTRAST TECHNIQUE: Angiographic images of the Circle of Willis were  obtained using MRA technique without intravenous contrast. COMPARISON:  MRI head 08/11/2016 FINDINGS: Both vertebral arteries patent to the basilar without stenosis. PICA patent bilaterally. Basilar widely patent. Superior cerebellar arteries patent bilaterally. Moderate stenosis distal left posterior cerebral artery. Right posterior cerebral artery appears widely patent. Cavernous carotid widely patent bilaterally without stenosis. Both anterior cerebral arteries appear normal. Right middle cerebral artery appears normal. Left M1 segment patent. There is moderate disease in the left parietal branches of the left MCA with focal stenosis in two branches. The temporal branch is widely patent. Negative for cerebral aneurysm. IMPRESSION: Moderate stenosis distal left posterior cerebral artery. Moderate stenoses in the parietal branches of the left MCA. Electronically Signed   By: Franchot Gallo M.D.   On: 08/11/2016 13:12   Mr Brain Wo Contrast  Result Date: 08/11/2016 CLINICAL DATA:  Slurred speech with right-sided weakness.  Stroke EXAM: MRI HEAD WITHOUT CONTRAST TECHNIQUE: Multiplanar, multiecho pulse sequences of the brain and surrounding structures were obtained without intravenous contrast. COMPARISON:  CT 08/11/2016 FINDINGS: Brain: Negative for acute infarct. Ventricle size normal. Cerebral volume normal. No significant chronic ischemia. Negative for hemorrhage or fluid collection. Negative for mass or edema. Vascular: Normal flow voids. Skull and upper cervical spine: Normal marrow signal. Sinuses/Orbits: Mucous retention cyst right maxillary sinus. Mild mucosal edema in the paranasal sinuses. Normal orbital structures. Other: None IMPRESSION: Negative MRI of the brain.  No acute abnormality. Electronically Signed   By: Franchot Gallo M.D.   On: 08/11/2016 13:08   US Carotid Bilateral (at [facility] Only)  Result Date: 08/12/2016 CLINICAL DATA:  CVA. EXAM: BILATERAL CAROTID DUPLEX ULTRASOUND TECHNIQUE:  Pearline Cables scale imaging, color Doppler and duplex ultrasound were performed of bilateral carotid and vertebral arteries in the neck. COMPARISON:  MRI 08/11/2016. FINDINGS: Criteria: Quantification of carotid stenosis is based on velocity parameters that correlate the residual internal carotid diameter with NASCET-based stenosis levels, using the diameter of the distal internal carotid lumen as the denominator for stenosis measurement. The following velocity measurements were obtained: RIGHT ICA:  66/24 cm/sec CCA:  [id number] cm/sec SYSTOLIC ICA/CCA RATIO:  0.8 DIASTOLIC ICA/CCA RATIO:  1.4 ECA:  97 cm/sec LEFT ICA:  65/20 cm/sec CCA:  0000000 cm/sec SYSTOLIC ICA/CCA RATIO:  0.7 DIASTOLIC ICA/CCA RATIO:  1.2 ECA:  90 cm/sec RIGHT CAROTID ARTERY: No significant right carotid atherosclerotic vascular disease. RIGHT VERTEBRAL ARTERY:  Patent with antegrade flow. LEFT CAROTID ARTERY: No significant left carotid atherosclerotic vascular disease. LEFT VERTEBRAL ARTERY:  Patent with antegrade flow. An arrhythmia cannot be excluded. IMPRESSION: 1. No significant carotid atherosclerotic vascular disease. 2. Vertebral arteries are patent antegrade flow. 3.  An arrhythmia cannot be excluded . Electronically Signed   By: Marcello Moores  Register   On: 08/12/2016 09:43   EEG -- This is a normal recording of the awake and drowsy states.    Subjective: Feels well, no complaint, eager to go home.   Discharge Exam: Vitals:   08/12/16 1600 08/12/16 2000  BP: 120/71 129/73  Pulse: 81 71  Resp: 20 20  Temp: 98.1 F (36.7 C) 98.5 F (36.9 C)   Vitals:   08/12/16 0800 08/12/16 1202 08/12/16 1600 08/12/16 2000  BP: 123/70 115/70 120/71 129/73  Pulse: 64  80 81 71  Resp: 16 18 20 20   Temp: 97.8 F (36.6 C) 98.3 F (36.8 C) 98.1 F (36.7 C) 98.5 F (36.9 C)  TempSrc: Oral Oral Oral Oral  SpO2: 96% 98% 99% 100%  Weight:      Height:        General: Pt is alert, awake, not in acute distress Cardiovascular: RRR, S1/S2 +, no rubs,  no gallops Respiratory: CTA bilaterally, no wheezing, no rhonchi Abdominal: Soft, NT, ND, bowel sounds + Extremities: no edema, no cyanosis    The results of significant diagnostics from this hospitalization (including imaging, microbiology, ancillary and laboratory) are listed below for reference.     Microbiology: No results found for this or any previous visit (from the past 240 hour(s)).   Labs: BNP (last 3 results) No results for input(s): BNP in the last 8760 hours. Basic Metabolic Panel:  Recent Labs Lab 08/11/16 1105 08/11/16 1110  NA 138 141  K 4.1 4.2  CL 103 105  CO2 28  --   GLUCOSE 100* 98  BUN 14 14  CREATININE 0.80 0.80  CALCIUM 9.6  --    Liver Function Tests:  Recent Labs Lab 08/11/16 1105  AST 23  ALT 22  ALKPHOS 75  BILITOT 0.3  PROT 7.6  ALBUMIN 4.3   No results for input(s): LIPASE, AMYLASE in the last 168 hours. No results for input(s): AMMONIA in the last 168 hours. CBC:  Recent Labs Lab 08/11/16 1105 08/11/16 1110  WBC 8.7  --   NEUTROABS 3.9  --   HGB 14.8 15.3*  HCT 43.7 45.0  MCV 96.3  --   PLT 357  --    Cardiac Enzymes:  Recent Labs Lab 08/11/16 1113  TROPONINI <0.03   BNP: Invalid input(s): POCBNP CBG:  Recent Labs Lab 08/11/16 1101  GLUCAP 90   D-Dimer No results for input(s): DDIMER in the last 72 hours. Hgb A1c No results for input(s): HGBA1C in the last 72 hours. Lipid Profile  Recent Labs  08/12/16 0619  CHOL 235*  HDL 63  LDLCALC 151*  TRIG 104  CHOLHDL 3.7   Thyroid function studies No results for input(s): TSH, T4TOTAL, T3FREE, THYROIDAB in the last 72 hours.  Invalid input(s): FREET3 Anemia work up No results for input(s): VITAMINB12, FOLATE, FERRITIN, TIBC, IRON, RETICCTPCT in the last 72 hours. Urinalysis    Component Value Date/Time   COLORURINE STRAW (A) 08/11/2016 1059   APPEARANCEUR CLEAR 08/11/2016 1059   LABSPEC <1.005 (L) 08/11/2016 1059   PHURINE 6.0 08/11/2016 1059    GLUCOSEU NEGATIVE 08/11/2016 1059   HGBUR NEGATIVE 08/11/2016 1059   BILIRUBINUR NEGATIVE 08/11/2016 1059   KETONESUR NEGATIVE 08/11/2016 1059   PROTEINUR NEGATIVE 08/11/2016 1059   UROBILINOGEN 0.2 12/11/2014 1113   NITRITE NEGATIVE 08/11/2016 1059   LEUKOCYTESUR NEGATIVE 08/11/2016 1059   Sepsis Labs Invalid input(s): PROCALCITONIN,  WBC,  LACTICIDVEN Microbiology No results found for this or any previous visit (from the past 240 hour(s)).   Time coordinating discharge: Over 30 minutes  SIGNED:   Vianne Bulls, MD  [facility] 08/12/2016, 10:09 PM Pager   If 7PM-7AM, please contact night-coverage [email or web address] Password [email or web address]

## 2016-08-12 NOTE — Progress Notes (Signed)
Pt given discharge instructions, PIV removed, pt discharged home.

## 2016-08-12 NOTE — Evaluation (Signed)
Physical Therapy Evaluation Patient Details Name: Erica Simmons MRN: [id number] DOB: 1971-05-12 Today's Date: 08/12/2016   History of Present Illness  Erica Simmons is a 45 y.o. female with medical history significant of fibromyalgia, GERD, was in her usual state of health this morning when at approximately 10 AM her husband noticed that she was having difficulty speaking. He reports that she was able to comprehend what was being said to her, but was unable to express her words. She was able to write down what she wanted to say. Transiently, she did not recognize her husband. She reports having a significant headache which has been present intermittently for the past several weeks. She has associated dizziness when headache is present. She also has paresthesias on the left side of her head. She reports that during her headache, she is unable to see out of her peripheral field of vision bilaterally  Clinical Impression  Pt received up ambulating in the room, just finished using the bathroom, husband present, and pt is agreeable to PT evaluation.  Pt is normally independent at home with ambulation, and ADL's, IADL's.  She enjoys working outside including gardening.  Pt expressed that an issue with chronic pain has limited her ability to participate in formal exercise.  During PT evaluation she ambulated 453ft with no LOB despite being challenged with head turns, and picking an item up off the floor.  She was able to hold single leg stance on each side x 10 seconds each, but has great difficulty with tandem stance - only able to hold up to 6 seconds.  Pt was also able to negotiate 5 steps independently.  She was educated on role of ankle strength in maintaining balance, and gastroc/soleus stretch as well as heel raises for strengthening. Although she does demonstrate some minor balance deficits, she likely will be able to work on this on her own with exercises given.  Therefore, at this point she does not  demonstrate need for skilled PT.  Will sign off.      Follow Up Recommendations No PT follow up    Equipment Recommendations  None recommended by PT    Recommendations for Other Services       Precautions / Restrictions        Mobility  Bed Mobility Overal bed mobility: Independent                Transfers Overall transfer level: Independent                  Ambulation/Gait Ambulation/Gait assistance: Independent Ambulation Distance (Feet): 400 Feet Assistive device: None Gait Pattern/deviations: WFL(Within Functional Limits)        Stairs Stairs: Yes Stairs assistance: Independent Stair Management: No rails;Alternating pattern;Forwards Number of Stairs: 5    Wheelchair Mobility    Modified Rankin (Stroke Patients Only)       Balance Overall balance assessment: Needs assistance               Single Leg Stance - Right Leg: 10 Single Leg Stance - Left Leg: 10 Tandem Stance - Right Leg: 6 Tandem Stance - Left Leg: 8     High level balance activites: Head turns;Direction changes High Level Balance Comments: Pt had difficulty with tandem stance             Pertinent Vitals/Pain      Home Living  Prior Function                 Hand Dominance        Extremity/Trunk Assessment   Upper Extremity Assessment: Overall WFL for tasks assessed           Lower Extremity Assessment: Overall WFL for tasks assessed         Communication      Cognition                            General Comments      Exercises Other Exercises Other Exercises: 5 x sit<>stand in 8.97 seconds without using UE's.  Other Exercises: B gastroc stretch 1 x 30 second, B soleus stretch 1 x 30 seconds  Other Exercises: 10 reps of heel raises    Assessment/Plan    PT Assessment Patent does not need any further PT services  PT Problem List            PT Treatment Interventions      PT  Goals (Current goals can be found in the Care Plan section)  Acute Rehab PT Goals PT Goal Formulation: All assessment and education complete, DC therapy    Frequency     Barriers to discharge        Co-evaluation               End of Session Equipment Utilized During Treatment: Gait belt Activity Tolerance: Patient tolerated treatment well Patient left: in chair           Time: 1111-1135 PT Time Calculation (min) (ACUTE ONLY): 24 min   Charges:   PT Evaluation $PT Eval Low Complexity: 1 Procedure PT Treatments $Gait Training: 8-22 mins   PT G Codes:        Beth Veda Arrellano, PT, DPT X: [phone]

## 2016-08-12 NOTE — Procedures (Signed)
  Erica A. Merlene Laughter, MD     [email or web address]           HISTORY: The patient presents with episodic confusion and shaking.  MEDICATIONS: Scheduled Meds: .  stroke: mapping our early stages of recovery book   Does not apply Once  . aspirin  300 mg Rectal Daily   Or  . aspirin  325 mg Oral Daily  . enoxaparin (LOVENOX) injection  40 mg Subcutaneous Q24H  . methylPREDNISolone (SOLU-MEDROL) injection  500 mg Intravenous Q24H  . metoCLOPramide (REGLAN) injection  10 mg Intravenous Q6H  . nicotine  14 mg Transdermal Daily  . pantoprazole  40 mg Oral Daily  . topiramate  25 mg Oral BID   Continuous Infusions: . sodium chloride 1,000 mL (08/11/16 1818)   PRN Meds:.acetaminophen **OR** acetaminophen, ALPRAZolam, ketorolac, morphine injection, ondansetron, senna-docusate  Prior to Admission medications   Medication Sig Start Date End Date Taking? Authorizing Provider  ALPRAZolam Duanne Moron) 0.5 MG tablet Take 0.5 mg by mouth 2 (two) times daily as needed for anxiety. 05/21/16  Yes Historical Provider, MD  Multiple Vitamins-Minerals (HM MULTIVITAMIN ADULT GUMMY PO) Take 1 tablet by mouth daily.   Yes Historical Provider, MD  omeprazole (PRILOSEC) 40 MG capsule Take 40 mg by mouth daily.  07/15/16  Yes Historical Provider, MD  ondansetron (ZOFRAN) 4 MG tablet Take 1 tablet (4 mg total) by mouth every 8 (eight) hours as needed for nausea or vomiting. 04/20/16  Yes Margette Fast, MD  Oxycodone HCl 10 MG TABS Take 1 tablet by mouth 3 (three) times daily as needed (for pain). Reported on 02/21/2016 01/28/16  Yes Historical Provider, MD  oxyCODONE-acetaminophen (PERCOCET/ROXICET) 5-325 MG tablet Take 2 tablets by mouth every 4 (four) hours as needed for severe pain. Patient not taking: Reported on 08/11/2016 04/20/16   Margette Fast, MD      ANALYSIS: A 16 channel recording using standard 10 20 measurements is conducted for 21 minutes. There is a well-formed posterior dominant  rhythm of 10 HZ. This background activity attenuates with eye opening. There is beta activity observed in the frontal areas. Awake and drowsy activities are observed. Photic simulation and hyperventilation are not conducted. There is no focal or lateralized slowing. There is no epileptiform activity is observed.   IMPRESSION: This is a normal recording of the awake and drowsy states.       Erica Simmons, M.D.  Diplomate, Tax adviser of Psychiatry and Neurology ( Neurology).

## 2016-08-12 NOTE — Progress Notes (Signed)
Pt c/o of headache and feeling hot. Pt was sweating and stated feeling anxious. She stated that she wakes up two to three times a night feeling warm and sweaty from headaches. Pt vital signs were stable and given a clean gown and cool washcloth to base of neck and forehead. PRN Xanax 0.5 mg  PO was given and stated she feels better and wants to rest. Will continue to monitor.

## 2016-08-12 NOTE — Progress Notes (Signed)
RN paged NP because pt wants to leave AMA. Pt adamant on leaving. Said she did not want to wait for neuro to come back to speak with her. Pt is alert and oriented. Hx of leaving AMA about 2 mos ago as well. RN to have pt sign AMA papers.  KJKG, NP [facility]

## 2016-08-12 NOTE — Care Management Note (Signed)
Case Management Note  Patient Details  Name: Erica Simmons MRN: [id number] Date of Birth: 1970-12-16  Subjective/Objective:                  Pt admitted with slurred speech, r/o CVA. Chart reviewed for CM needs. Pt is from home, lives with family. She has PCP, transportation and insurance with drug coverage. Her deficits have resolved. Pt plans to return home with self care.   Action/Plan: No CM needs anticipated.   Expected Discharge Date:    08/13/2016              Expected Discharge Plan:  Home/Self Care  In-House Referral:  NA  Discharge planning Services  CM Consult  Post Acute Care Choice:  NA Choice offered to:  NA  Status of Service:  Completed, signed off   Sherald Barge, RN 08/12/2016, 2:17 PM

## 2016-08-12 NOTE — Progress Notes (Signed)
PROGRESS NOTE    Erica Simmons  [id number] DOB: [birth year] DOA: 08/11/2016 PCP: Wende Neighbors, MD    Brief Narrative:  59 yof with a hx of fibromyalgia and GERD, presented with speech difficulty, in which she had to interpret what she was saying by writing on paper. She also reported a headache in which she could not see out of her peripheral field of vision bilaterally. While in the ED, stroke workup was negative and her serology was unremarkable except for urine screen that was positive for benzodiazepines. She was admitted for further evaluation of expressive aphasia.   Assessment & Plan:   Active Problems:   Tobacco abuse   Pituitary adenoma ([place])   Slurred speech   Expressive aphasia   GERD (gastroesophageal reflux disease)  1. Expressive aphasia. Etiology is unclear. MRI of the brain is negative for acute infarct. MRA head shows Moderate stenosis distal left posterior cerebral artery. An EEG, echocardiogram, and carotid dropplers have been ordered. Urine drug screen was positive for benzodiazepine. Neurology was consulted and felt that symptoms may be secondary to headache.  Recommendations were for discontinuing opioids and starting the patient on Topamax, high-dose steroids, Reglan, Toradol, and aspirin.  2. GERD. Continue PPI. 3. Tobacco use. Counseled on the importance of cessation. Nicotine patch has been provided. 4. History of pituitary adenoma. Followed as an outpatient.  DVT prophylaxis: Lovenox  Code Status: FULL  Family Communication: husband at bedside Disposition Plan: Discharge home once improved.     Consultants:   Neurology   Procedures:   None   Antimicrobials:   None    Subjective: Continues to have headache. Speech is normal. Now having general body pain due to fibromyalgia  Objective: Vitals:   08/12/16 0000 08/12/16 0200 08/12/16 0400 08/12/16 0600  BP: 113/64 (!) 103/54 112/66 (!) 106/54  Pulse: 67 65 66 (!) 59  Resp: 18 18 19 17     Temp: 98.3 F (36.8 C) 98.1 F (36.7 C) 97.8 F (36.6 C) 97.7 F (36.5 C)  TempSrc: Oral Oral Oral Oral  SpO2: 97% 96% 96% 99%  Weight:      Height:        Intake/Output Summary (Last 24 hours) at 08/12/16 0733 Last data filed at 08/12/16 0300  Gross per 24 hour  Intake            946.5 ml  Output                0 ml  Net            946.5 ml   Filed Weights   08/11/16 1058  Weight: 70.3 kg (155 lb)    Examination:  General exam: Appears calm and comfortable  Respiratory system: Clear to auscultation. Respiratory effort normal. Cardiovascular system: S1 & S2 heard, RRR. No JVD, murmurs, rubs, gallops or clicks. No pedal edema. Gastrointestinal system: Abdomen is nondistended, soft and nontender. No organomegaly or masses felt. Normal bowel sounds heard. Central nervous system: Alert and oriented. No focal neurological deficits. Extremities: Symmetric 5 x 5 power. Skin: No rashes, lesions or ulcers Psychiatry: Judgement and insight appear normal. Mood & affect appropriate.     Data Reviewed: I have personally reviewed following labs and imaging studies  CBC:  Recent Labs Lab 08/11/16 1105 08/11/16 1110  WBC 8.7  --   NEUTROABS 3.9  --   HGB 14.8 15.3*  HCT 43.7 45.0  MCV 96.3  --   PLT 357  --    Basic  Metabolic Panel:  Recent Labs Lab 08/11/16 1105 08/11/16 1110  NA 138 141  K 4.1 4.2  CL 103 105  CO2 28  --   GLUCOSE 100* 98  BUN 14 14  CREATININE 0.80 0.80  CALCIUM 9.6  --    GFR: Estimated Creatinine Clearance: 85.3 mL/min (by C-G formula based on SCr of 0.8 mg/dL). Liver Function Tests:  Recent Labs Lab 08/11/16 1105  AST 23  ALT 22  ALKPHOS 75  BILITOT 0.3  PROT 7.6  ALBUMIN 4.3   No results for input(s): LIPASE, AMYLASE in the last 168 hours. No results for input(s): AMMONIA in the last 168 hours. Coagulation Profile:  Recent Labs Lab 08/11/16 1105  INR 0.86   Cardiac Enzymes:  Recent Labs Lab 08/11/16 1113   TROPONINI <0.03   BNP (last 3 results) No results for input(s): PROBNP in the last 8760 hours. HbA1C: No results for input(s): HGBA1C in the last 72 hours. CBG:  Recent Labs Lab 08/11/16 1101  GLUCAP 90   Lipid Profile:  Recent Labs  08/12/16 0619  CHOL 235*  HDL 63  LDLCALC 151*  TRIG 104  CHOLHDL 3.7   Thyroid Function Tests: No results for input(s): TSH, T4TOTAL, FREET4, T3FREE, THYROIDAB in the last 72 hours. Anemia Panel: No results for input(s): VITAMINB12, FOLATE, FERRITIN, TIBC, IRON, RETICCTPCT in the last 72 hours. Sepsis Labs: No results for input(s): PROCALCITON, LATICACIDVEN in the last 168 hours.  No results found for this or any previous visit (from the past 240 hour(s)).       Radiology Studies: Ct Head Wo Contrast  Result Date: 08/11/2016 CLINICAL DATA:  Code stroke, headache, difficulty speaking. EXAM: CT HEAD WITHOUT CONTRAST TECHNIQUE: Contiguous axial images were obtained from the base of the skull through the vertex without intravenous contrast. COMPARISON:  11/10/2015. FINDINGS: Brain: No evidence of an acute infarct, acute hemorrhage, mass lesion, mass effect or hydrocephalus. Vascular: No hyperdense vessel or unexpected calcification. Skull: Normal. Negative for fracture or focal lesion. Sinuses/Orbits: No acute finding. Other: None. IMPRESSION: Negative. These results were called by telephone at the time of interpretation on 08/11/2016 at 11:24 am to Dr. Milton Ferguson , who verbally acknowledged these results. Electronically Signed   By: Lorin Picket M.D.   On: 08/11/2016 11:24   Mr Angiogram Head Wo Contrast  Result Date: 08/11/2016 CLINICAL DATA:  Stroke EXAM: MRA HEAD WITHOUT CONTRAST TECHNIQUE: Angiographic images of the Circle of Willis were obtained using MRA technique without intravenous contrast. COMPARISON:  MRI head 08/11/2016 FINDINGS: Both vertebral arteries patent to the basilar without stenosis. PICA patent bilaterally. Basilar  widely patent. Superior cerebellar arteries patent bilaterally. Moderate stenosis distal left posterior cerebral artery. Right posterior cerebral artery appears widely patent. Cavernous carotid widely patent bilaterally without stenosis. Both anterior cerebral arteries appear normal. Right middle cerebral artery appears normal. Left M1 segment patent. There is moderate disease in the left parietal branches of the left MCA with focal stenosis in two branches. The temporal branch is widely patent. Negative for cerebral aneurysm. IMPRESSION: Moderate stenosis distal left posterior cerebral artery. Moderate stenoses in the parietal branches of the left MCA. Electronically Signed   By: Franchot Gallo M.D.   On: 08/11/2016 13:12   Mr Brain Wo Contrast  Result Date: 08/11/2016 CLINICAL DATA:  Slurred speech with right-sided weakness.  Stroke EXAM: MRI HEAD WITHOUT CONTRAST TECHNIQUE: Multiplanar, multiecho pulse sequences of the brain and surrounding structures were obtained without intravenous contrast. COMPARISON:  CT 08/11/2016 FINDINGS:  Brain: Negative for acute infarct. Ventricle size normal. Cerebral volume normal. No significant chronic ischemia. Negative for hemorrhage or fluid collection. Negative for mass or edema. Vascular: Normal flow voids. Skull and upper cervical spine: Normal marrow signal. Sinuses/Orbits: Mucous retention cyst right maxillary sinus. Mild mucosal edema in the paranasal sinuses. Normal orbital structures. Other: None IMPRESSION: Negative MRI of the brain.  No acute abnormality. Electronically Signed   By: Franchot Gallo M.D.   On: 08/11/2016 13:08        Scheduled Meds: .  stroke: mapping our early stages of recovery book   Does not apply Once  . aspirin  300 mg Rectal Daily   Or  . aspirin  325 mg Oral Daily  . enoxaparin (LOVENOX) injection  40 mg Subcutaneous Q24H  . methylPREDNISolone (SOLU-MEDROL) injection  500 mg Intravenous Q24H  . metoCLOPramide (REGLAN) injection   10 mg Intravenous Q6H  . nicotine  14 mg Transdermal Daily  . pantoprazole  40 mg Oral Daily  . topiramate  25 mg Oral BID   Continuous Infusions: . sodium chloride 1,000 mL (08/11/16 1818)     LOS: 0 days    Time spent: 25 minutes     Kathie Dike, MD [facility] If 7PM-7AM, please contact night-coverage [email or web address] Password [facility] 08/12/2016, 7:33 AM

## 2016-08-12 NOTE — Progress Notes (Signed)
SLP Cancellation Note  Patient Details Name: Erica Simmons MRN: [id number] DOB: [birth year]   Cancelled treatment:       Reason Eval/Treat Not Completed: SLP screened, no needs identified, will sign off; Discussed with Dr. Roderic Palau. MRI and CT negative for acute event. Speech has returned to baseline.   Thank you,  Genene Churn, [place] 08/12/2016, 12:34 PM

## 2016-08-12 NOTE — Evaluation (Signed)
Occupational Therapy Evaluation Patient Details Name: Erica Simmons MRN: [id number] DOB: 1971-01-30 Today's Date: 08/12/2016    History of Present Illness Erica Simmons is a 45 y.o. female with medical history significant of fibromyalgia, GERD, was in her usual state of health this morning when at approximately 10 AM her husband noticed that she was having difficulty speaking. He reports that she was able to comprehend what was being said to her, but was unable to express her words. She was able to write down what she wanted to say. Transiently, she did not recognize her husband. She reports having a significant headache which has been present intermittently for the past several weeks. She has associated dizziness when headache is present. She also has paresthesias on the left side of her head. She reports that during her headache, she is unable to see out of her peripheral field of vision bilaterally   Clinical Impression   Pt awake, alert, oriented x 4 this am, agreeable to OT evaluation with husband at bedside. Pt reports continued HA this am, rating at 6/10 with peripheral vision impairment. Pt speech and word finding difficulties have resolved, pt reports she has experienced word finding difficulties in the past with severe headaches but not to this extreme. Pt demonstrates independence in ADL and functional mobility tasks during evaluation, no further OT services required at this time. Pt has good family support for assistance if/when necessary.     Follow Up Recommendations  No OT follow up    Equipment Recommendations  None recommended by OT       Precautions / Restrictions Precautions Precautions: None Restrictions Weight Bearing Restrictions: No      Mobility Bed Mobility Overal bed mobility: Independent                Transfers Overall transfer level: Independent                         ADL Overall ADL's : Modified independent;At baseline                                              Vision Vision Assessment?: Yes Eye Alignment: Within Functional Limits Ocular Range of Motion: Within Functional Limits Alignment/Gaze Preference: Within Defined Limits Tracking/Visual Pursuits: Able to track stimulus in all quads without difficulty Saccades: Within functional limits Convergence: Within functional limits Visual Fields: Other (comment) (L peripheral at 15 degrees, R peripheral at 50 degrees) Additional Comments: Pt reports peripheral vision loss with headaches, greater on left side          Pertinent Vitals/Pain Pain Assessment: 0-10 Pain Score: 6  Pain Location: left sided headache Pain Descriptors / Indicators: Headache Pain Intervention(s): Limited activity within patient's tolerance;Monitored during session     Hand Dominance Right   Extremity/Trunk Assessment Upper Extremity Assessment Upper Extremity Assessment: Generalized weakness;Overall [facility] for tasks assessed   Lower Extremity Assessment Lower Extremity Assessment: Defer to PT evaluation   Cervical / Trunk Assessment Cervical / Trunk Assessment: Normal   Communication Communication Communication: No difficulties   Cognition Arousal/Alertness: Awake/alert Behavior During Therapy: WFL for tasks assessed/performed Overall Cognitive Status: Within Functional Limits for tasks assessed                                Home  Living Family/patient expects to be discharged to:: Private residence Living Arrangements: Spouse/significant other Available Help at Discharge: Family;Available PRN/intermittently Type of Home: House             Bathroom Shower/Tub: Walk-in [employer] Toilet: Standard     Home Equipment: None          Prior Functioning/Environment Level of Independence: Independent        Comments: Pt independent in ADLs, IADLs, driving        OT Problem List: Pain    End of Session     Activity Tolerance: Patient tolerated treatment well Patient left: in bed;with call bell/phone within reach;with family/visitor present   Time: 0825-0841 OT Time Calculation (min): 16 min Charges:  OT General Charges $OT Visit: 1 Procedure OT Evaluation $OT Eval Low Complexity: 1 Procedure G-Codes: OT G-codes **NOT FOR INPATIENT CLASS** Functional Assessment Tool Used: clinical judgement Functional Limitation: Self care Self Care Current Status [id number]): At least 1 percent but less than 20 percent impaired, limited or restricted Self Care Goal Status [id number]): At least 1 percent but less than 20 percent impaired, limited or restricted Self Care Discharge Status [phone]): At least 1 percent but less than 20 percent impaired, limited or restricted   Guadelupe Sabin, OTR/L  [phone] 08/12/2016, 8:48 AM

## 2016-08-12 NOTE — Progress Notes (Signed)
*  PRELIMINARY RESULTS* Echocardiogram 2D Echocardiogram has been performed.  Samuel Germany 08/12/2016, 12:57 PM

## 2016-08-13 LAB — HEMOGLOBIN A1C
Hgb A1c MFr Bld: 5.9 % — ABNORMAL HIGH (ref 4.8–5.6)
Mean Plasma Glucose: 123 mg/dL

## 2016-09-11 ENCOUNTER — Ambulatory Visit (HOSPITAL_COMMUNITY)
Admission: RE | Admit: 2016-09-11 | Discharge: 2016-09-11 | Disposition: A | Discharge status: Home / Self Care | Payer: 59 | Source: Ambulatory Visit | Attending: Internal Medicine | Admitting: Internal Medicine

## 2016-09-11 ENCOUNTER — Other Ambulatory Visit (HOSPITAL_COMMUNITY): Payer: Self-pay | Admitting: Internal Medicine

## 2016-09-11 DIAGNOSIS — R0602 Shortness of breath: Secondary | ICD-10-CM | POA: Insufficient documentation

## 2016-09-11 DIAGNOSIS — R918 Other nonspecific abnormal finding of lung field: Secondary | ICD-10-CM | POA: Diagnosis not present

## 2016-09-11 DIAGNOSIS — R071 Chest pain on breathing: Secondary | ICD-10-CM | POA: Insufficient documentation

## 2016-10-01 ENCOUNTER — Other Ambulatory Visit (HOSPITAL_COMMUNITY): Payer: Self-pay | Admitting: Internal Medicine

## 2016-10-01 DIAGNOSIS — Z1231 Encounter for screening mammogram for malignant neoplasm of breast: Secondary | ICD-10-CM

## 2016-10-17 ENCOUNTER — Telehealth (HOSPITAL_COMMUNITY): Payer: Self-pay | Admitting: *Deleted

## 2016-10-17 NOTE — Telephone Encounter (Signed)
spoke with patient, who said she goes to [place] next month and she think they provides counseling.

## 2016-10-23 ENCOUNTER — Ambulatory Visit (HOSPITAL_COMMUNITY)
Admission: RE | Admit: 2016-10-23 | Discharge: 2016-10-23 | Disposition: A | Discharge status: Home / Self Care | Payer: 59 | Source: Ambulatory Visit | Attending: Internal Medicine | Admitting: Internal Medicine

## 2016-10-23 DIAGNOSIS — Z1231 Encounter for screening mammogram for malignant neoplasm of breast: Secondary | ICD-10-CM | POA: Diagnosis not present

## 2016-11-10 DIAGNOSIS — H538 Other visual disturbances: Secondary | ICD-10-CM | POA: Diagnosis not present

## 2016-11-10 DIAGNOSIS — D352 Benign neoplasm of pituitary gland: Secondary | ICD-10-CM | POA: Diagnosis not present

## 2016-11-10 DIAGNOSIS — R112 Nausea with vomiting, unspecified: Secondary | ICD-10-CM | POA: Diagnosis not present

## 2016-11-10 DIAGNOSIS — G43009 Migraine without aura, not intractable, without status migrainosus: Secondary | ICD-10-CM | POA: Diagnosis not present

## 2016-11-18 DIAGNOSIS — G43109 Migraine with aura, not intractable, without status migrainosus: Secondary | ICD-10-CM | POA: Diagnosis not present

## 2016-11-18 DIAGNOSIS — G441 Vascular headache, not elsewhere classified: Secondary | ICD-10-CM | POA: Diagnosis not present

## 2016-11-28 DIAGNOSIS — I6622 Occlusion and stenosis of left posterior cerebral artery: Secondary | ICD-10-CM | POA: Diagnosis not present

## 2016-11-28 DIAGNOSIS — G441 Vascular headache, not elsewhere classified: Secondary | ICD-10-CM | POA: Diagnosis not present

## 2016-11-28 DIAGNOSIS — G43109 Migraine with aura, not intractable, without status migrainosus: Secondary | ICD-10-CM | POA: Diagnosis not present

## 2017-01-14 DIAGNOSIS — Z7689 Persons encountering health services in other specified circumstances: Secondary | ICD-10-CM | POA: Diagnosis not present

## 2017-01-15 DIAGNOSIS — G43109 Migraine with aura, not intractable, without status migrainosus: Secondary | ICD-10-CM | POA: Diagnosis not present

## 2017-03-26 DIAGNOSIS — R768 Other specified abnormal immunological findings in serum: Secondary | ICD-10-CM | POA: Diagnosis not present

## 2017-03-30 DIAGNOSIS — F5101 Primary insomnia: Secondary | ICD-10-CM | POA: Diagnosis not present

## 2017-03-30 DIAGNOSIS — J060 Acute laryngopharyngitis: Secondary | ICD-10-CM | POA: Diagnosis not present

## 2017-06-02 DIAGNOSIS — M542 Cervicalgia: Secondary | ICD-10-CM | POA: Diagnosis not present

## 2017-06-29 DIAGNOSIS — R300 Dysuria: Secondary | ICD-10-CM | POA: Diagnosis not present

## 2017-06-29 DIAGNOSIS — R110 Nausea: Secondary | ICD-10-CM | POA: Diagnosis not present

## 2017-06-30 DIAGNOSIS — M791 Myalgia: Secondary | ICD-10-CM | POA: Diagnosis not present

## 2017-06-30 DIAGNOSIS — R768 Other specified abnormal immunological findings in serum: Secondary | ICD-10-CM | POA: Diagnosis not present

## 2017-06-30 DIAGNOSIS — M2550 Pain in unspecified joint: Secondary | ICD-10-CM | POA: Diagnosis not present

## 2017-06-30 DIAGNOSIS — D8989 Other specified disorders involving the immune mechanism, not elsewhere classified: Secondary | ICD-10-CM | POA: Diagnosis not present

## 2017-07-16 DIAGNOSIS — M791 Myalgia, unspecified site: Secondary | ICD-10-CM | POA: Diagnosis not present

## 2017-07-16 DIAGNOSIS — M7061 Trochanteric bursitis, right hip: Secondary | ICD-10-CM | POA: Diagnosis not present

## 2017-07-16 DIAGNOSIS — R768 Other specified abnormal immunological findings in serum: Secondary | ICD-10-CM | POA: Diagnosis not present

## 2017-10-25 DIAGNOSIS — R197 Diarrhea, unspecified: Secondary | ICD-10-CM | POA: Diagnosis not present

## 2017-10-25 DIAGNOSIS — A084 Viral intestinal infection, unspecified: Secondary | ICD-10-CM | POA: Diagnosis not present

## 2017-10-30 DIAGNOSIS — A084 Viral intestinal infection, unspecified: Secondary | ICD-10-CM | POA: Diagnosis not present

## 2017-11-03 ENCOUNTER — Encounter: Payer: Self-pay | Admitting: Gastroenterology

## 2017-12-18 ENCOUNTER — Ambulatory Visit: Payer: 59 | Admitting: Gastroenterology

## 2017-12-18 ENCOUNTER — Encounter: Payer: Self-pay | Admitting: Gastroenterology

## 2017-12-31 ENCOUNTER — Other Ambulatory Visit (HOSPITAL_COMMUNITY): Payer: Self-pay | Admitting: Nurse Practitioner

## 2017-12-31 ENCOUNTER — Ambulatory Visit (HOSPITAL_COMMUNITY)
Admission: RE | Admit: 2017-12-31 | Discharge: 2017-12-31 | Disposition: A | Discharge status: Home / Self Care | Payer: 59 | Source: Ambulatory Visit | Attending: Nurse Practitioner | Admitting: Nurse Practitioner

## 2017-12-31 DIAGNOSIS — R109 Unspecified abdominal pain: Secondary | ICD-10-CM | POA: Insufficient documentation

## 2017-12-31 DIAGNOSIS — R1084 Generalized abdominal pain: Secondary | ICD-10-CM | POA: Diagnosis not present

## 2017-12-31 DIAGNOSIS — K219 Gastro-esophageal reflux disease without esophagitis: Secondary | ICD-10-CM | POA: Diagnosis not present

## 2017-12-31 DIAGNOSIS — A084 Viral intestinal infection, unspecified: Secondary | ICD-10-CM | POA: Diagnosis not present

## 2017-12-31 DIAGNOSIS — R112 Nausea with vomiting, unspecified: Secondary | ICD-10-CM | POA: Diagnosis not present

## 2018-01-06 ENCOUNTER — Emergency Department (HOSPITAL_COMMUNITY)
Admission: EM | Admit: 2018-01-06 | Discharge: 2018-01-06 | Disposition: A | Discharge status: Home / Self Care | Payer: 59 | Attending: Emergency Medicine | Admitting: Emergency Medicine

## 2018-01-06 ENCOUNTER — Emergency Department (HOSPITAL_COMMUNITY): Payer: 59

## 2018-01-06 ENCOUNTER — Other Ambulatory Visit: Payer: Self-pay

## 2018-01-06 ENCOUNTER — Encounter (HOSPITAL_COMMUNITY): Payer: Self-pay | Admitting: *Deleted

## 2018-01-06 DIAGNOSIS — R55 Syncope and collapse: Secondary | ICD-10-CM | POA: Insufficient documentation

## 2018-01-06 DIAGNOSIS — Z5321 Procedure and treatment not carried out due to patient leaving prior to being seen by health care provider: Secondary | ICD-10-CM | POA: Insufficient documentation

## 2018-01-06 DIAGNOSIS — R0602 Shortness of breath: Secondary | ICD-10-CM | POA: Diagnosis not present

## 2018-01-06 DIAGNOSIS — R079 Chest pain, unspecified: Secondary | ICD-10-CM | POA: Diagnosis not present

## 2018-01-06 LAB — BASIC METABOLIC PANEL
Anion gap: 11 (ref 5–15)
BUN: 10 mg/dL (ref 6–20)
CALCIUM: 9.3 mg/dL (ref 8.9–10.3)
CO2: 24 mmol/L (ref 22–32)
Chloride: 103 mmol/L (ref 101–111)
Creatinine, Ser: 0.76 mg/dL (ref 0.44–1.00)
GFR calc Af Amer: >60 mL/min (ref >60)
GLUCOSE: 119 mg/dL — AB (ref 65–99)
POTASSIUM: 4.1 mmol/L (ref 3.5–5.1)
Sodium: 138 mmol/L (ref 135–145)

## 2018-01-06 LAB — CBC
HEMATOCRIT: 38.9 % (ref 36.0–46.0)
Hemoglobin: 12.5 g/dL (ref 12.0–15.0)
MCH: 32.1 pg (ref 26.0–34.0)
MCHC: 32.1 g/dL (ref 30.0–36.0)
MCV: 99.7 fL (ref 78.0–100.0)
Platelets: 318 10*3/uL (ref 150–400)
RBC: 3.9 MIL/uL (ref 3.87–5.11)
RDW: 13.7 % (ref 11.5–15.5)
WBC: 7.6 10*3/uL (ref 4.0–10.5)

## 2018-01-06 LAB — I-STAT TROPONIN, ED: Troponin i, poc: 0 ng/mL (ref 0.00–0.08)

## 2018-01-06 LAB — I-STAT BETA HCG BLOOD, ED (MC, WL, AP ONLY): I-stat hCG, quantitative: <5 m[IU]/mL (ref <5)

## 2018-01-06 NOTE — ED Notes (Signed)
Pt called in lobby for vitals update. No response noted.

## 2018-01-06 NOTE — ED Notes (Signed)
Second call in lobby for vitals update. No response. 

## 2018-01-06 NOTE — ED Notes (Addendum)
Pt came to desk stating that she wanted to leave and that her primary care Dr called her and stated that he got her in with a Cardiologist in the morning.

## 2018-01-06 NOTE — ED Triage Notes (Signed)
Pt in c/o multiple syncopal episodes last night, husband reports that last night patient passed out several times and he had a portal O2 sensor and when she is passing out her HR is dropping extremely low and then pausing, O2 drops into the 70s, HR then increases and patient wakes up. Pt reports fatigue and chest pain, states she feels like she has been hit by a truck today, states she just has no energy, alert and oriented.

## 2018-01-22 DIAGNOSIS — Z87898 Personal history of other specified conditions: Secondary | ICD-10-CM | POA: Diagnosis not present

## 2018-01-22 DIAGNOSIS — R55 Syncope and collapse: Secondary | ICD-10-CM | POA: Diagnosis not present

## 2018-01-28 ENCOUNTER — Other Ambulatory Visit: Payer: Self-pay | Admitting: Gastroenterology

## 2018-01-28 DIAGNOSIS — R112 Nausea with vomiting, unspecified: Secondary | ICD-10-CM | POA: Diagnosis not present

## 2018-01-28 DIAGNOSIS — K529 Noninfective gastroenteritis and colitis, unspecified: Secondary | ICD-10-CM | POA: Diagnosis not present

## 2018-01-28 DIAGNOSIS — D352 Benign neoplasm of pituitary gland: Secondary | ICD-10-CM | POA: Diagnosis not present

## 2018-02-01 DIAGNOSIS — K529 Noninfective gastroenteritis and colitis, unspecified: Secondary | ICD-10-CM | POA: Diagnosis not present

## 2018-02-01 DIAGNOSIS — R197 Diarrhea, unspecified: Secondary | ICD-10-CM | POA: Diagnosis not present

## 2018-02-01 DIAGNOSIS — R112 Nausea with vomiting, unspecified: Secondary | ICD-10-CM | POA: Diagnosis not present

## 2018-02-01 DIAGNOSIS — K2930 Chronic superficial gastritis without bleeding: Secondary | ICD-10-CM | POA: Diagnosis not present

## 2018-02-01 DIAGNOSIS — K2970 Gastritis, unspecified, without bleeding: Secondary | ICD-10-CM | POA: Diagnosis not present

## 2018-02-01 DIAGNOSIS — K210 Gastro-esophageal reflux disease with esophagitis: Secondary | ICD-10-CM | POA: Diagnosis not present

## 2018-02-03 ENCOUNTER — Emergency Department (HOSPITAL_COMMUNITY)
Admission: EM | Admit: 2018-02-03 | Discharge: 2018-02-03 | Disposition: A | Discharge status: Home / Self Care | Payer: 59 | Attending: Emergency Medicine | Admitting: Emergency Medicine

## 2018-02-03 ENCOUNTER — Emergency Department (HOSPITAL_COMMUNITY): Payer: 59

## 2018-02-03 ENCOUNTER — Other Ambulatory Visit: Payer: Self-pay

## 2018-02-03 ENCOUNTER — Encounter (HOSPITAL_COMMUNITY): Payer: Self-pay | Admitting: Emergency Medicine

## 2018-02-03 DIAGNOSIS — T7491XA Unspecified adult maltreatment, confirmed, initial encounter: Secondary | ICD-10-CM | POA: Diagnosis not present

## 2018-02-03 DIAGNOSIS — S0003XA Contusion of scalp, initial encounter: Secondary | ICD-10-CM | POA: Diagnosis not present

## 2018-02-03 DIAGNOSIS — R51 Headache: Secondary | ICD-10-CM | POA: Diagnosis not present

## 2018-02-03 DIAGNOSIS — S060X0A Concussion without loss of consciousness, initial encounter: Secondary | ICD-10-CM | POA: Diagnosis not present

## 2018-02-03 DIAGNOSIS — Z79899 Other long term (current) drug therapy: Secondary | ICD-10-CM | POA: Insufficient documentation

## 2018-02-03 DIAGNOSIS — S0993XA Unspecified injury of face, initial encounter: Secondary | ICD-10-CM | POA: Diagnosis not present

## 2018-02-03 DIAGNOSIS — H57813 Brow ptosis, bilateral: Secondary | ICD-10-CM | POA: Diagnosis not present

## 2018-02-03 MED ORDER — HYDROCODONE-ACETAMINOPHEN 5-325 MG PO TABS: 1.0000 | ORAL_TABLET | Freq: Four times a day (QID) | ORAL | 0 refills | Status: DC | PRN

## 2018-02-03 MED ORDER — HYDROCODONE-ACETAMINOPHEN 5-325 MG PO TABS
1.0000 | ORAL_TABLET | Freq: Once | ORAL | Status: AC
  Administered 2018-02-03: 1 via ORAL
  Filled 2018-02-03: qty 1
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
## 2018-02-03 NOTE — Discharge Instructions (Signed)
Take Tylenol for mild pain or the pain medicine prescribed for bad pain.  Do not take Tylenol together with the pain medicine prescribed as the combination can be dangerous to your liver.  Do not drink alcohol with the pain medicine prescribed. contact Dr.[place] to arrange to be seen in the office if you continue to have confusion or difficulty concentrating in the next 1 or 2 weeks
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
## 2018-02-04 DIAGNOSIS — S0093XD Contusion of unspecified part of head, subsequent encounter: Secondary | ICD-10-CM | POA: Diagnosis not present

## 2018-02-08 ENCOUNTER — Ambulatory Visit
Admission: RE | Admit: 2018-02-08 | Discharge: 2018-02-08 | Disposition: A | Discharge status: Home / Self Care | Payer: 59 | Source: Ambulatory Visit | Attending: Gastroenterology | Admitting: Gastroenterology

## 2018-02-08 DIAGNOSIS — R112 Nausea with vomiting, unspecified: Secondary | ICD-10-CM | POA: Diagnosis not present

## 2018-02-09 ENCOUNTER — Other Ambulatory Visit (HOSPITAL_COMMUNITY): Payer: Self-pay | Admitting: Gastroenterology

## 2018-02-09 DIAGNOSIS — R197 Diarrhea, unspecified: Secondary | ICD-10-CM | POA: Diagnosis not present

## 2018-02-09 DIAGNOSIS — A0472 Enterocolitis due to Clostridium difficile, not specified as recurrent: Secondary | ICD-10-CM | POA: Diagnosis not present

## 2018-02-09 DIAGNOSIS — R1011 Right upper quadrant pain: Secondary | ICD-10-CM | POA: Diagnosis not present

## 2018-02-16 ENCOUNTER — Ambulatory Visit: Payer: Self-pay | Admitting: Surgery

## 2018-02-16 DIAGNOSIS — R110 Nausea: Secondary | ICD-10-CM | POA: Diagnosis not present

## 2018-02-16 NOTE — H&P (Signed)
Erica Simmons Documented: 02/16/2018 2:42 PM Location: [place] Patient #: [id number] DOB: 09-Apr-1971 Married / Language: English / Race: White Female  History of Present Illness (Sheamus Hasting A. Kae Heller MD; 02/16/2018 3:15 PM) Patient words: 47 year old woman referred by Dr. Alferd Apa GI for consideration of cholecystectomy. She initially visited with him on April 25 with intractable nausea, vomiting, and chronic diarrhea.   by her report, this began in December and she thought it was a bad stomach bug, but it has never gone away. She describes nearly constant nausea and vomiting to the point that her teeth are eroding. Also has to take multiple antidiarrheals. gets indigestion even with water. She also describes weight gain, about 35 pounds in the last 4 years. Had some crampy left sided flank pain but this is now moved to the right and this was provoked when she had her ultrasound she felt pain on the right subcostal margin which radiates to the back.  Ultrasound of the abdomen showed a contracted gallbladder but no gallstones. She was positive for C. difficile. The remainder of her extensive labs were unremarkable. This included a chromogranin a, tissue transglutaminase IgA, ESR, pancreatic elastase (fecal), fecal leukocytes, GI pathogen panel. She had had CBC and BMP at an ER visit prior to this which was unremarkable. She has been treated with oral vancomycin for the C. difficile, although it sounds like her colonoscopy did not show any pseudomembranes. She is taking omeprazole.  HIDA is pending, looks like is scheduled for 2 days from now.  Upper and lower endoscopy negative except for a medium hiatal hernia, non-severe esophagitis at the GE junction, localized mild inflammation with erythema and friability in the gastric antrum.  Her medical history is notable for syncope, endometriosis, pituitary adenoma.  She has had several diagnostic laparoscopies for endometriosis,  abdominal hysterectomy and appendectomy.  She takes Zofran, in addition to the PPI and vancomycin currently. No known drug allergies.  Family history notable for liver disease and diabetes in her mother. Ovarian cancer in her sister and breast cancer in maternal aunt.  She is a current half pack per day smoker for the last 20 years. Occasional alcohol, no illicit drug use.she lives with her husband and her elderly father. Her mother passed away in the last year. She has 2 daughters, the youngest of whom has just moved out to go to college. She and her husband just moved here from [place] not too long ago. She has significant stress.  The patient is a 47 year old female.   Past Surgical History Benjiman Core, [place]; 02/16/2018 2:47 PM) No pertinent past surgical history  Diagnostic Studies History Benjiman Core, [place]; 02/16/2018 2:47 PM) Mammogram 1-3 years ago Pap Smear >5 years ago  Allergies Benjiman Core, CMA; 02/16/2018 2:59 PM) IBUPROFEN stomach upset  Medication History (Armen Eulas Post, CMA; 02/16/2018 3:00 PM) Vancomycin HCl (125MG Capsule, Oral) Active. Medications Reconciled  Social History Benjiman Core, CMA; 02/16/2018 2:47 PM) Alcohol use Moderate alcohol use. Caffeine use Carbonated beverages, Coffee, Tea. No drug use Tobacco use Current every day smoker.  Family History Benjiman Core, [place]; 02/16/2018 2:47 PM) Alcohol Abuse Mother. Bleeding disorder Sister. Depression Mother. Diabetes Mellitus Mother. Heart Disease Father. Heart disease in female family member before age 45 Heart disease in female family member before age 20 Ovarian Cancer Sister. Respiratory Condition Father.  Pregnancy / Birth History Benjiman Core, [place]; 02/16/2018 2:47 PM) Age at menarche 3 years. Age of menopause <47 Contraceptive History Oral contraceptives. Gravida 4 Irregular periods  Maternal age 39-20 Para 2  Other Problems Benjiman Core, CMA; 02/16/2018 2:47  PM) Anxiety Disorder Back Pain Gastric Ulcer Gastroesophageal Reflux Disease Hemorrhoids Migraine Headache     Review of Systems (Armen Glenn CMA; 02/16/2018 2:47 PM) General Present- Chills, Fatigue and Weight Gain. Not Present- Appetite Loss, Fever, Night Sweats and Weight Loss. Skin Present- Dryness and Non-Healing Wounds. Not Present- Change in Wart/Mole, Hives, Jaundice, New Lesions, Rash and Ulcer. HEENT Present- Earache, Seasonal Allergies and Wears glasses/contact lenses. Not Present- Hearing Loss, Hoarseness, Nose Bleed, Oral Ulcers, Ringing in the Ears, Sinus Pain, Sore Throat, Visual Disturbances and Yellow Eyes. Respiratory Present- Snoring. Not Present- Bloody sputum, Chronic Cough, Difficulty Breathing and Wheezing. Cardiovascular Present- Difficulty Breathing Lying Down, Leg Cramps and Palpitations. Not Present- Chest Pain, Rapid Heart Rate, Shortness of Breath and Swelling of Extremities. Gastrointestinal Present- Abdominal Pain, Bloating, Bloody Stool, Chronic diarrhea, Difficulty Swallowing, Excessive gas, Gets full quickly at meals, Hemorrhoids, Indigestion, Nausea, Rectal Pain and Vomiting. Not Present- Change in Bowel Habits and Constipation. Musculoskeletal Present- Back Pain, Joint Pain, Joint Stiffness, Muscle Pain and Muscle Weakness. Not Present- Swelling of Extremities. Neurological Present- Decreased Memory, Fainting, Headaches, Tremor, Trouble walking and Weakness. Not Present- Numbness, Seizures and Tingling. Psychiatric Present- Anxiety and Frequent crying. Not Present- Bipolar, Change in Sleep Pattern, Depression and Fearful. Endocrine Present- Excessive Hunger. Not Present- Cold Intolerance, Hair Changes, Heat Intolerance, Hot flashes and New Diabetes. Hematology Present- Easy Bruising. Not Present- Blood Thinners, Excessive bleeding, Gland problems, HIV and Persistent Infections.  Vitals (Armen Glenn CMA; 02/16/2018 2:47 PM) 02/16/2018 2:47 PM Weight:  163.38 lb Height: 63in Body Surface Area: 1.77 m Body Mass Index: 28.94 kg/m  Temp.: 98.50F  Pulse: 105 (Regular)  P.OX: 99% (Room air) BP: 116/82 (Sitting, Left Arm, Standard)      Physical Exam (Ilyssa Grennan A. Kae Heller MD; 02/16/2018 3:16 PM)  The physical exam findings are as follows: Note:Gen: alert and well appearing Eye: extraocular motion intact, no scleral icterus ENT: moist mucus membranes, dentition intactbut there is some erosion near the gumline Neck: no mass or thyromegaly Chest: unlabored respirations, symmetrical air entry, clear bilaterally CV: regular rate and rhythm, no pedal edema Abdomen: soft, mildly tender in the epigastrium and right upper quadrant, nondistended. No mass or organomegaly MSK: strength symmetrical throughout, no deformity Neuro: grossly intact, normal gait Psych: normal mood and affect, appropriate insight Skin: warm and dry, no rash or lesion on limited exam    Assessment & Plan (Epifania Littrell A. Kae Heller MD; 02/16/2018 3:17 PM)  NAUSEA (R11.0) Story: We'll tentatively go ahead and start the scheduling process for laparoscopic cholecystectomy pending results of HIDA scan which is to be done in 2 days. We discussed the surgery including the risk of bleeding, infection, pain, scarring, intra-abdominal injury specifically to the common bile duct and sequelae, conversion to open surgery, pneumonia, heart attack, stroke, blood clots, and most significantly in her case, the failure to resolve her symptoms. I discussed with her that nausea is very often a symptom of gallbladder dysfunction however diarrhea is unlikely to be connected. If she continues to have issues she will need to undergo further diagnostic testing. If her HIDA scan is normal, this may need to be done first.

## 2018-02-16 NOTE — H&P (View-Only) (Signed)
Erica Simmons Documented: 02/16/2018 2:42 PM Location: [facility] Patient #: [id number] DOB: 25-Nov-1970 Married / Language: English / Race: White Female  History of Present Illness (Chelsea A. Kae Heller MD; 02/16/2018 3:15 PM) Patient words: 47 year old woman referred by Dr. Alferd Apa GI for consideration of cholecystectomy. She initially visited with him on April 25 with intractable nausea, vomiting, and chronic diarrhea.   by her report, this began in December and she thought it was a bad stomach bug, but it has never gone away. She describes nearly constant nausea and vomiting to the point that her teeth are eroding. Also has to take multiple antidiarrheals. gets indigestion even with water. She also describes weight gain, about 35 pounds in the last 4 years. Had some crampy left sided flank pain but this is now moved to the right and this was provoked when she had her ultrasound she felt pain on the right subcostal margin which radiates to the back.  Ultrasound of the abdomen showed a contracted gallbladder but no gallstones. She was positive for C. difficile. The remainder of her extensive labs were unremarkable. This included a chromogranin a, tissue transglutaminase IgA, ESR, pancreatic elastase (fecal), fecal leukocytes, GI pathogen panel. She had had CBC and BMP at an ER visit prior to this which was unremarkable. She has been treated with oral vancomycin for the C. difficile, although it sounds like her colonoscopy did not show any pseudomembranes. She is taking omeprazole.  HIDA is pending, looks like is scheduled for 2 days from now.  Upper and lower endoscopy negative except for a medium hiatal hernia, non-severe esophagitis at the GE junction, localized mild inflammation with erythema and friability in the gastric antrum.  Her medical history is notable for syncope, endometriosis, pituitary adenoma.  She has had several diagnostic laparoscopies for endometriosis,  abdominal hysterectomy and appendectomy.  She takes Zofran, in addition to the PPI and vancomycin currently. No known drug allergies.  Family history notable for liver disease and diabetes in her mother. Ovarian cancer in her sister and breast cancer in maternal aunt.  She is a current half pack per day smoker for the last 20 years. Occasional alcohol, no illicit drug use.she lives with her husband and her elderly father. Her mother passed away in the last year. She has 2 daughters, the youngest of whom has just moved out to go to college. She and her husband just moved here from [place] not too long ago. She has significant stress.  The patient is a 47 year old female.   Past Surgical History Benjiman Core, [place]; 02/16/2018 2:47 PM) No pertinent past surgical history  Diagnostic Studies History Benjiman Core, [place]; 02/16/2018 2:47 PM) Mammogram 1-3 years ago Pap Smear >5 years ago  Allergies Benjiman Core, CMA; 02/16/2018 2:59 PM) IBUPROFEN stomach upset  Medication History (Armen Eulas Post, CMA; 02/16/2018 3:00 PM) Vancomycin HCl (125MG Capsule, Oral) Active. Medications Reconciled  Social History Benjiman Core, CMA; 02/16/2018 2:47 PM) Alcohol use Moderate alcohol use. Caffeine use Carbonated beverages, Coffee, Tea. No drug use Tobacco use Current every day smoker.  Family History Benjiman Core, [place]; 02/16/2018 2:47 PM) Alcohol Abuse Mother. Bleeding disorder Sister. Depression Mother. Diabetes Mellitus Mother. Heart Disease Father. Heart disease in female family member before age 33 Heart disease in female family member before age 77 Ovarian Cancer Sister. Respiratory Condition Father.  Pregnancy / Birth History Benjiman Core, [place]; 02/16/2018 2:47 PM) Age at menarche 49 years. Age of menopause <47 Contraceptive History Oral contraceptives. Gravida 4 Irregular periods  Maternal age 29-20 Para 2  Other Problems Benjiman Core, CMA; 02/16/2018 2:47  PM) Anxiety Disorder Back Pain Gastric Ulcer Gastroesophageal Reflux Disease Hemorrhoids Migraine Headache     Review of Systems (Armen Glenn CMA; 02/16/2018 2:47 PM) General Present- Chills, Fatigue and Weight Gain. Not Present- Appetite Loss, Fever, Night Sweats and Weight Loss. Skin Present- Dryness and Non-Healing Wounds. Not Present- Change in Wart/Mole, Hives, Jaundice, New Lesions, Rash and Ulcer. HEENT Present- Earache, Seasonal Allergies and Wears glasses/contact lenses. Not Present- Hearing Loss, Hoarseness, Nose Bleed, Oral Ulcers, Ringing in the Ears, Sinus Pain, Sore Throat, Visual Disturbances and Yellow Eyes. Respiratory Present- Snoring. Not Present- Bloody sputum, Chronic Cough, Difficulty Breathing and Wheezing. Cardiovascular Present- Difficulty Breathing Lying Down, Leg Cramps and Palpitations. Not Present- Chest Pain, Rapid Heart Rate, Shortness of Breath and Swelling of Extremities. Gastrointestinal Present- Abdominal Pain, Bloating, Bloody Stool, Chronic diarrhea, Difficulty Swallowing, Excessive gas, Gets full quickly at meals, Hemorrhoids, Indigestion, Nausea, Rectal Pain and Vomiting. Not Present- Change in Bowel Habits and Constipation. Musculoskeletal Present- Back Pain, Joint Pain, Joint Stiffness, Muscle Pain and Muscle Weakness. Not Present- Swelling of Extremities. Neurological Present- Decreased Memory, Fainting, Headaches, Tremor, Trouble walking and Weakness. Not Present- Numbness, Seizures and Tingling. Psychiatric Present- Anxiety and Frequent crying. Not Present- Bipolar, Change in Sleep Pattern, Depression and Fearful. Endocrine Present- Excessive Hunger. Not Present- Cold Intolerance, Hair Changes, Heat Intolerance, Hot flashes and New Diabetes. Hematology Present- Easy Bruising. Not Present- Blood Thinners, Excessive bleeding, Gland problems, HIV and Persistent Infections.  Vitals (Armen Glenn CMA; 02/16/2018 2:47 PM) 02/16/2018 2:47 PM Weight:  163.38 lb Height: 63in Body Surface Area: 1.77 m Body Mass Index: 28.94 kg/m  Temp.: 98.40F  Pulse: 105 (Regular)  P.OX: 99% (Room air) BP: 116/82 (Sitting, Left Arm, Standard)      Physical Exam (Chelsea A. Kae Heller MD; 02/16/2018 3:16 PM)  The physical exam findings are as follows: Note:Gen: alert and well appearing Eye: extraocular motion intact, no scleral icterus ENT: moist mucus membranes, dentition intactbut there is some erosion near the gumline Neck: no mass or thyromegaly Chest: unlabored respirations, symmetrical air entry, clear bilaterally CV: regular rate and rhythm, no pedal edema Abdomen: soft, mildly tender in the epigastrium and right upper quadrant, nondistended. No mass or organomegaly MSK: strength symmetrical throughout, no deformity Neuro: grossly intact, normal gait Psych: normal mood and affect, appropriate insight Skin: warm and dry, no rash or lesion on limited exam    Assessment & Plan (Chelsea A. Kae Heller MD; 02/16/2018 3:17 PM)  NAUSEA (R11.0) Story: We'll tentatively go ahead and start the scheduling process for laparoscopic cholecystectomy pending results of HIDA scan which is to be done in 2 days. We discussed the surgery including the risk of bleeding, infection, pain, scarring, intra-abdominal injury specifically to the common bile duct and sequelae, conversion to open surgery, pneumonia, heart attack, stroke, blood clots, and most significantly in her case, the failure to resolve her symptoms. I discussed with her that nausea is very often a symptom of gallbladder dysfunction however diarrhea is unlikely to be connected. If she continues to have issues she will need to undergo further diagnostic testing. If her HIDA scan is normal, this may need to be done first.

## 2018-02-18 ENCOUNTER — Ambulatory Visit (HOSPITAL_COMMUNITY)
Admission: RE | Admit: 2018-02-18 | Discharge: 2018-02-18 | Disposition: A | Discharge status: Home / Self Care | Payer: 59 | Source: Ambulatory Visit | Attending: Gastroenterology | Admitting: Gastroenterology

## 2018-02-18 DIAGNOSIS — R1011 Right upper quadrant pain: Secondary | ICD-10-CM | POA: Diagnosis present

## 2018-02-18 MED ORDER — TECHNETIUM TC 99M MEBROFENIN IV KIT
5.3900 | PACK | Freq: Once | INTRAVENOUS | Status: AC | PRN
  Administered 2018-02-18: 5.39 via INTRAVENOUS

## 2018-02-22 ENCOUNTER — Encounter (HOSPITAL_COMMUNITY): Payer: Self-pay

## 2018-02-22 NOTE — Patient Instructions (Signed)
Your procedure is scheduled on: Wednesday, Feb 24, 2018   Surgery Time:  10:30AM-12:00N   Report to [facility]  Entrance    Report to admitting at 8:30 AM   Call this number if you have problems the morning of surgery [phone]   Do not eat food or drink liquids :After Midnight.   Do NOT smoke after Midnight   Take these medicines the morning of surgery with A SIP OF WATER: None   Bring Asthma Inhaler day of surgery                               You may not have any metal on your body including hair pins, jewelry, and body piercings             Do not wear make-up, lotions, powders, perfumes/cologne, or deodorant             Do not wear nail polish.  Do not shave  48 hours prior to surgery.                Do not bring valuables to the hospital. [place].   Contacts, dentures or bridgework may not be worn into surgery.    Patients discharged the day of surgery will not be allowed to drive home.   Special Instructions: Bring a copy of your healthcare power of attorney and living will documents         the day of surgery if you haven't scanned them in before.              Please read over the following fact sheets you were given:  [facility] - Preparing for Surgery Before surgery, you can play an important role.  Because skin is not sterile, your skin needs to be as free of germs as possible.  You can reduce the number of germs on your skin by washing with CHG (chlorahexidine gluconate) soap before surgery.  CHG is an antiseptic cleaner which kills germs and bonds with the skin to continue killing germs even after washing. Please DO NOT use if you have an allergy to CHG or antibacterial soaps.  If your skin becomes reddened/irritated stop using the CHG and inform your nurse when you arrive at Short Stay. Do not shave (including legs and underarms) for at least 48 hours prior to the first CHG shower.  You may  shave your face/neck.  Please follow these instructions carefully:  1.  Shower with CHG Soap the night before surgery and the  morning of surgery.  2.  If you choose to wash your hair, wash your hair first as usual with your normal  shampoo.  3.  After you shampoo, rinse your hair and body thoroughly to remove the shampoo.                             4.  Use CHG as you would any other liquid soap.  You can apply chg directly to the skin and wash.  Gently with a scrungie or clean washcloth.  5.  Apply the CHG Soap to your body ONLY FROM THE NECK DOWN.   Do   not use on face/ open  Wound or open sores. Avoid contact with eyes, ears mouth and   genitals (private parts).                       Wash face,  Genitals (private parts) with your normal soap.             6.  Wash thoroughly, paying special attention to the area where your    surgery  will be performed.  7.  Thoroughly rinse your body with warm water from the neck down.  8.  DO NOT shower/wash with your normal soap after using and rinsing off the CHG Soap.                9.  Pat yourself dry with a clean towel.            10.  Wear clean pajamas.            11.  Place clean sheets on your bed the night of your first shower and do not  sleep with pets. Day of Surgery : Do not apply any lotions/deodorants the morning of surgery.  Please wear clean clothes to the hospital/surgery center.  FAILURE TO FOLLOW THESE INSTRUCTIONS MAY RESULT IN THE CANCELLATION OF YOUR SURGERY  PATIENT SIGNATURE_________________________________  NURSE SIGNATURE__________________________________  ________________________________________________________________________

## 2018-02-22 NOTE — Pre-Procedure Instructions (Signed)
EKG 01/06/2018 in epic.

## 2018-02-23 ENCOUNTER — Other Ambulatory Visit: Payer: Self-pay

## 2018-02-23 ENCOUNTER — Encounter (HOSPITAL_COMMUNITY): Payer: Self-pay

## 2018-02-23 ENCOUNTER — Encounter (HOSPITAL_COMMUNITY)
Admission: RE | Admit: 2018-02-23 | Discharge: 2018-02-23 | Disposition: A | Discharge status: Home / Self Care | Payer: 59 | Source: Ambulatory Visit | Attending: Surgery | Admitting: Surgery

## 2018-02-23 DIAGNOSIS — K828 Other specified diseases of gallbladder: Secondary | ICD-10-CM | POA: Diagnosis present

## 2018-02-23 DIAGNOSIS — Z886 Allergy status to analgesic agent status: Secondary | ICD-10-CM | POA: Diagnosis not present

## 2018-02-23 DIAGNOSIS — J45909 Unspecified asthma, uncomplicated: Secondary | ICD-10-CM | POA: Diagnosis not present

## 2018-02-23 DIAGNOSIS — Z79899 Other long term (current) drug therapy: Secondary | ICD-10-CM | POA: Diagnosis not present

## 2018-02-23 DIAGNOSIS — F17210 Nicotine dependence, cigarettes, uncomplicated: Secondary | ICD-10-CM | POA: Diagnosis not present

## 2018-02-23 DIAGNOSIS — K811 Chronic cholecystitis: Secondary | ICD-10-CM | POA: Diagnosis not present

## 2018-02-23 HISTORY — DX: Endometriosis, unspecified: N80.9

## 2018-02-23 HISTORY — DX: Fatty (change of) liver, not elsewhere classified: K76.0

## 2018-02-23 HISTORY — DX: Cardiac murmur, unspecified: R01.1

## 2018-02-23 HISTORY — DX: Adverse effect of unspecified anesthetic, initial encounter: T41.45XA

## 2018-02-23 HISTORY — DX: Unspecified asthma, uncomplicated: J45.909

## 2018-02-23 HISTORY — DX: Prediabetes: R73.03

## 2018-02-23 HISTORY — DX: Gastro-esophageal reflux disease without esophagitis: K21.9

## 2018-02-23 HISTORY — DX: Migraine, unspecified, not intractable, without status migrainosus: G43.909

## 2018-02-23 HISTORY — DX: Unspecified adult maltreatment, confirmed, initial encounter: T74.91XA

## 2018-02-23 HISTORY — DX: Anxiety disorder, unspecified: F41.9

## 2018-02-23 HISTORY — DX: Other complications of anesthesia, initial encounter: T88.59XA

## 2018-02-23 HISTORY — DX: Benign neoplasm of pituitary gland: D35.2

## 2018-02-23 HISTORY — DX: Other specified endocrine disorders: E34.8

## 2018-02-23 HISTORY — DX: Personal history of other specified conditions: Z87.898

## 2018-02-23 LAB — CBC WITH DIFFERENTIAL/PLATELET
BASOS ABS: 0 10*3/uL (ref 0.0–0.1)
BASOS PCT: 0 %
EOS ABS: 0.2 10*3/uL (ref 0.0–0.7)
EOS PCT: 3 %
HEMATOCRIT: 39.5 % (ref 36.0–46.0)
Hemoglobin: 13.1 g/dL (ref 12.0–15.0)
Lymphocytes Relative: 46 %
Lymphs Abs: 3.5 10*3/uL (ref 0.7–4.0)
MCH: 32.9 pg (ref 26.0–34.0)
MCHC: 33.2 g/dL (ref 30.0–36.0)
MCV: 99.2 fL (ref 78.0–100.0)
Monocytes Absolute: 0.4 10*3/uL (ref 0.1–1.0)
Monocytes Relative: 5 %
NEUTROS ABS: 3.5 10*3/uL (ref 1.7–7.7)
Neutrophils Relative %: 46 %
PLATELETS: 353 10*3/uL (ref 150–400)
RBC: 3.98 MIL/uL (ref 3.87–5.11)
RDW: 14.1 % (ref 11.5–15.5)
WBC: 7.7 10*3/uL (ref 4.0–10.5)

## 2018-02-23 LAB — COMPREHENSIVE METABOLIC PANEL
ALBUMIN: 4.4 g/dL (ref 3.5–5.0)
ALT: 23 U/L (ref 14–54)
ANION GAP: 9 (ref 5–15)
AST: 27 U/L (ref 15–41)
Alkaline Phosphatase: 64 U/L (ref 38–126)
BUN: 13 mg/dL (ref 6–20)
CHLORIDE: 107 mmol/L (ref 101–111)
CO2: 22 mmol/L (ref 22–32)
Calcium: 9.3 mg/dL (ref 8.9–10.3)
Creatinine, Ser: 0.73 mg/dL (ref 0.44–1.00)
GFR calc Af Amer: >60 mL/min (ref >60)
GFR calc non Af Amer: >60 mL/min (ref >60)
GLUCOSE: 92 mg/dL (ref 65–99)
POTASSIUM: 4.3 mmol/L (ref 3.5–5.1)
SODIUM: 138 mmol/L (ref 135–145)
Total Bilirubin: 0.7 mg/dL (ref 0.3–1.2)
Total Protein: 7.7 g/dL (ref 6.5–8.1)

## 2018-02-23 LAB — HEMOGLOBIN A1C
Hgb A1c MFr Bld: 5.7 % — ABNORMAL HIGH (ref 4.8–5.6)
Mean Plasma Glucose: 116.89 mg/dL

## 2018-02-24 ENCOUNTER — Encounter (HOSPITAL_COMMUNITY): Payer: Self-pay

## 2018-02-24 ENCOUNTER — Ambulatory Visit (HOSPITAL_COMMUNITY)
Admission: RE | Admit: 2018-02-24 | Discharge: 2018-02-24 | Disposition: A | Discharge status: Home / Self Care | Payer: 59 | Source: Ambulatory Visit | Attending: Surgery | Admitting: Surgery

## 2018-02-24 ENCOUNTER — Ambulatory Visit (HOSPITAL_COMMUNITY): Payer: 59 | Admitting: Certified Registered Nurse Anesthetist

## 2018-02-24 ENCOUNTER — Encounter (HOSPITAL_COMMUNITY)
Admission: RE | Disposition: A | Discharge status: Home / Self Care | Payer: Self-pay | Source: Ambulatory Visit | Attending: Surgery

## 2018-02-24 DIAGNOSIS — E872 Acidosis: Secondary | ICD-10-CM | POA: Diagnosis not present

## 2018-02-24 DIAGNOSIS — F17210 Nicotine dependence, cigarettes, uncomplicated: Secondary | ICD-10-CM | POA: Insufficient documentation

## 2018-02-24 DIAGNOSIS — Z79899 Other long term (current) drug therapy: Secondary | ICD-10-CM | POA: Diagnosis not present

## 2018-02-24 DIAGNOSIS — J45909 Unspecified asthma, uncomplicated: Secondary | ICD-10-CM | POA: Insufficient documentation

## 2018-02-24 DIAGNOSIS — K828 Other specified diseases of gallbladder: Secondary | ICD-10-CM | POA: Diagnosis not present

## 2018-02-24 DIAGNOSIS — Z886 Allergy status to analgesic agent status: Secondary | ICD-10-CM | POA: Diagnosis not present

## 2018-02-24 DIAGNOSIS — K219 Gastro-esophageal reflux disease without esophagitis: Secondary | ICD-10-CM | POA: Diagnosis not present

## 2018-02-24 DIAGNOSIS — K811 Chronic cholecystitis: Secondary | ICD-10-CM | POA: Insufficient documentation

## 2018-02-24 DIAGNOSIS — K824 Cholesterolosis of gallbladder: Secondary | ICD-10-CM | POA: Diagnosis not present

## 2018-02-24 HISTORY — PX: CHOLECYSTECTOMY: SHX55

## 2018-02-24 SURGERY — LAPAROSCOPIC CHOLECYSTECTOMY
Anesthesia: General | Site: Abdomen

## 2018-02-24 MED ORDER — CELECOXIB 200 MG PO CAPS
ORAL_CAPSULE | ORAL | Status: AC
  Filled 2018-02-24: qty 1

## 2018-02-24 MED ORDER — METOCLOPRAMIDE HCL 5 MG/ML IJ SOLN: 10.0000 mg | Freq: Once | INTRAMUSCULAR | Status: DC | PRN

## 2018-02-24 MED ORDER — ACETAMINOPHEN 325 MG PO TABS: 650.0000 mg | ORAL_TABLET | ORAL | Status: DC | PRN

## 2018-02-24 MED ORDER — SUGAMMADEX SODIUM 500 MG/5ML IV SOLN
INTRAVENOUS | Status: DC | PRN
Start: 2018-02-24 — End: 2018-02-24
  Administered 2018-02-24: 300 mg via INTRAVENOUS

## 2018-02-24 MED ORDER — FENTANYL CITRATE (PF) 250 MCG/5ML IJ SOLN
INTRAMUSCULAR | Status: AC
  Filled 2018-02-24: qty 5

## 2018-02-24 MED ORDER — ONDANSETRON HCL 4 MG/2ML IJ SOLN
INTRAMUSCULAR | Status: DC | PRN
  Administered 2018-02-24: 4 mg via INTRAVENOUS

## 2018-02-24 MED ORDER — LACTATED RINGERS IV SOLN: INTRAVENOUS | Status: DC

## 2018-02-24 MED ORDER — 0.9 % SODIUM CHLORIDE (POUR BTL) OPTIME
TOPICAL | Status: DC | PRN
  Administered 2018-02-24: 1000 mL

## 2018-02-24 MED ORDER — SODIUM CHLORIDE 0.9% FLUSH: 3.0000 mL | Freq: Two times a day (BID) | INTRAVENOUS | Status: DC

## 2018-02-24 MED ORDER — MIDAZOLAM HCL 5 MG/5ML IJ SOLN
INTRAMUSCULAR | Status: DC | PRN
  Administered 2018-02-24: 2 mg via INTRAVENOUS

## 2018-02-24 MED ORDER — SODIUM CHLORIDE 0.9% FLUSH: 3.0000 mL | INTRAVENOUS | Status: DC | PRN

## 2018-02-24 MED ORDER — LIDOCAINE 20MG/ML (2%) 15 ML SYRINGE OPTIME
INTRAMUSCULAR | Status: DC | PRN
  Administered 2018-02-24: 1.5 mg/kg/h via INTRAVENOUS

## 2018-02-24 MED ORDER — LACTATED RINGERS IV SOLN
INTRAVENOUS | Status: DC
  Administered 2018-02-24: 09:00:00 via INTRAVENOUS

## 2018-02-24 MED ORDER — ACETAMINOPHEN 500 MG PO TABS
ORAL_TABLET | ORAL | Status: AC
  Filled 2018-02-24: qty 2

## 2018-02-24 MED ORDER — MEPERIDINE HCL 50 MG/ML IJ SOLN: 6.2500 mg | INTRAMUSCULAR | Status: DC | PRN

## 2018-02-24 MED ORDER — FENTANYL CITRATE (PF) 250 MCG/5ML IJ SOLN
INTRAMUSCULAR | Status: DC | PRN
  Administered 2018-02-24 (×2): 100 ug via INTRAVENOUS
  Administered 2018-02-24: 50 ug via INTRAVENOUS

## 2018-02-24 MED ORDER — KETAMINE HCL 10 MG/ML IJ SOLN
INTRAMUSCULAR | Status: AC
  Filled 2018-02-24: qty 1

## 2018-02-24 MED ORDER — ALBUTEROL SULFATE (2.5 MG/3ML) 0.083% IN NEBU
2.5000 mg | INHALATION_SOLUTION | Freq: Once | RESPIRATORY_TRACT | Status: AC
  Administered 2018-02-24: 2.5 mg via RESPIRATORY_TRACT

## 2018-02-24 MED ORDER — LIDOCAINE 2% (20 MG/ML) 5 ML SYRINGE
INTRAMUSCULAR | Status: AC
Start: 2018-02-24 — End: ?
  Filled 2018-02-24: qty 5

## 2018-02-24 MED ORDER — CELECOXIB 200 MG PO CAPS: 200.0000 mg | ORAL_CAPSULE | ORAL | Status: DC

## 2018-02-24 MED ORDER — CEFAZOLIN SODIUM-DEXTROSE 2-4 GM/100ML-% IV SOLN
2.0000 g | INTRAVENOUS | Status: AC
  Administered 2018-02-24: 2 g via INTRAVENOUS
  Filled 2018-02-24: qty 100

## 2018-02-24 MED ORDER — BUPIVACAINE-EPINEPHRINE 0.25% -1:200000 IJ SOLN
INTRAMUSCULAR | Status: DC | PRN
  Administered 2018-02-24: 30 mL

## 2018-02-24 MED ORDER — BUPIVACAINE-EPINEPHRINE (PF) 0.25% -1:200000 IJ SOLN
INTRAMUSCULAR | Status: AC
  Filled 2018-02-24: qty 30

## 2018-02-24 MED ORDER — DEXAMETHASONE SODIUM PHOSPHATE 10 MG/ML IJ SOLN
INTRAMUSCULAR | Status: DC | PRN
  Administered 2018-02-24: 10 mg via INTRAVENOUS

## 2018-02-24 MED ORDER — ALBUTEROL SULFATE (2.5 MG/3ML) 0.083% IN NEBU: 2.5000 mg | INHALATION_SOLUTION | Freq: Four times a day (QID) | RESPIRATORY_TRACT | Status: DC | PRN

## 2018-02-24 MED ORDER — OXYCODONE HCL 5 MG PO TABS
5.0000 mg | ORAL_TABLET | ORAL | Status: DC | PRN
  Administered 2018-02-24: 10 mg via ORAL

## 2018-02-24 MED ORDER — ESMOLOL HCL 100 MG/10ML IV SOLN
INTRAVENOUS | Status: DC | PRN
  Administered 2018-02-24: 50 mg via INTRAVENOUS

## 2018-02-24 MED ORDER — FENTANYL CITRATE (PF) 100 MCG/2ML IJ SOLN
INTRAMUSCULAR | Status: AC
  Filled 2018-02-24: qty 2

## 2018-02-24 MED ORDER — KETAMINE HCL 10 MG/ML IJ SOLN
INTRAMUSCULAR | Status: DC | PRN
  Administered 2018-02-24: 10 mg via INTRAVENOUS
  Administered 2018-02-24: 30 mg via INTRAVENOUS

## 2018-02-24 MED ORDER — GABAPENTIN 300 MG PO CAPS
ORAL_CAPSULE | ORAL | Status: AC
  Filled 2018-02-24: qty 1

## 2018-02-24 MED ORDER — LACTATED RINGERS IR SOLN
Status: DC | PRN
  Administered 2018-02-24: 1000 mL

## 2018-02-24 MED ORDER — ROCURONIUM BROMIDE 10 MG/ML (PF) SYRINGE
PREFILLED_SYRINGE | INTRAVENOUS | Status: DC | PRN
  Administered 2018-02-24: 50 mg via INTRAVENOUS

## 2018-02-24 MED ORDER — ROCURONIUM BROMIDE 10 MG/ML (PF) SYRINGE
PREFILLED_SYRINGE | INTRAVENOUS | Status: AC
  Filled 2018-02-24: qty 5

## 2018-02-24 MED ORDER — CHLORHEXIDINE GLUCONATE 4 % EX LIQD: 60.0000 mL | Freq: Once | CUTANEOUS | Status: DC

## 2018-02-24 MED ORDER — GABAPENTIN 300 MG PO CAPS
300.0000 mg | ORAL_CAPSULE | ORAL | Status: AC
  Administered 2018-02-24: 300 mg via ORAL

## 2018-02-24 MED ORDER — FENTANYL CITRATE (PF) 100 MCG/2ML IJ SOLN: 25.0000 ug | INTRAMUSCULAR | Status: DC | PRN

## 2018-02-24 MED ORDER — ACETAMINOPHEN 500 MG PO TABS
1000.0000 mg | ORAL_TABLET | ORAL | Status: AC
  Administered 2018-02-24: 1000 mg via ORAL

## 2018-02-24 MED ORDER — SODIUM CHLORIDE 0.9 % IV SOLN: 250.0000 mL | INTRAVENOUS | Status: DC | PRN

## 2018-02-24 MED ORDER — DEXAMETHASONE SODIUM PHOSPHATE 10 MG/ML IJ SOLN
INTRAMUSCULAR | Status: AC
  Filled 2018-02-24: qty 1

## 2018-02-24 MED ORDER — ALBUTEROL SULFATE (2.5 MG/3ML) 0.083% IN NEBU
INHALATION_SOLUTION | RESPIRATORY_TRACT | Status: AC
  Administered 2018-02-24: 2.5 mg via RESPIRATORY_TRACT
  Filled 2018-02-24: qty 3

## 2018-02-24 MED ORDER — OXYCODONE HCL 5 MG PO TABS
ORAL_TABLET | ORAL | Status: DC
Start: 2018-02-24 — End: 2018-02-24
  Filled 2018-02-24: qty 2

## 2018-02-24 MED ORDER — ACETAMINOPHEN 650 MG RE SUPP: 650.0000 mg | RECTAL | Status: DC | PRN

## 2018-02-24 MED ORDER — MIDAZOLAM HCL 2 MG/2ML IJ SOLN
INTRAMUSCULAR | Status: AC
  Filled 2018-02-24: qty 2

## 2018-02-24 MED ORDER — SUCCINYLCHOLINE CHLORIDE 200 MG/10ML IV SOSY
PREFILLED_SYRINGE | INTRAVENOUS | Status: AC
Start: 2018-02-24 — End: ?
  Filled 2018-02-24: qty 20

## 2018-02-24 MED ORDER — SUGAMMADEX SODIUM 500 MG/5ML IV SOLN
INTRAVENOUS | Status: AC
  Filled 2018-02-24: qty 5

## 2018-02-24 MED ORDER — OXYCODONE-ACETAMINOPHEN 5-325 MG PO TABS: 1.0000 | ORAL_TABLET | Freq: Four times a day (QID) | ORAL | 0 refills | Status: AC | PRN

## 2018-02-24 MED ORDER — FENTANYL CITRATE (PF) 100 MCG/2ML IJ SOLN
25.0000 ug | INTRAMUSCULAR | Status: DC | PRN
  Administered 2018-02-24: 25 ug via INTRAVENOUS
  Administered 2018-02-24: 50 ug via INTRAVENOUS
  Administered 2018-02-24: 25 ug via INTRAVENOUS

## 2018-02-24 MED ORDER — ONDANSETRON HCL 4 MG/2ML IJ SOLN
INTRAMUSCULAR | Status: AC
  Filled 2018-02-24: qty 2

## 2018-02-24 SURGICAL SUPPLY — 31 items
APPLIER CLIP ROT 10 11.4 M/L (STAPLE) ×3
CABLE HIGH FREQUENCY MONO STRZ (ELECTRODE) ×3 IMPLANT
CHLORAPREP W/TINT 26ML (MISCELLANEOUS) ×3 IMPLANT
CLIP APPLIE ROT 10 11.4 M/L (STAPLE) ×1 IMPLANT
COVER MAYO STAND STRL (DRAPES) IMPLANT
COVER SURGICAL LIGHT HANDLE (MISCELLANEOUS) ×3 IMPLANT
DECANTER SPIKE VIAL GLASS SM (MISCELLANEOUS) ×3 IMPLANT
DERMABOND ADVANCED (GAUZE/BANDAGES/DRESSINGS) ×2
DERMABOND ADVANCED .7 DNX12 (GAUZE/BANDAGES/DRESSINGS) ×1 IMPLANT
DRAPE C-ARM 42X120 X-RAY (DRAPES) IMPLANT
ELECT REM PT RETURN 15FT ADLT (MISCELLANEOUS) ×3 IMPLANT
GLOVE BIO SURGEON STRL SZ 6 (GLOVE) ×3 IMPLANT
GLOVE INDICATOR 6.5 STRL GRN (GLOVE) ×3 IMPLANT
GOWN STRL REUS W/TWL LRG LVL3 (GOWN DISPOSABLE) ×3 IMPLANT
GOWN STRL REUS W/TWL XL LVL3 (GOWN DISPOSABLE) ×6 IMPLANT
GRASPER SUT TROCAR 14GX15 (MISCELLANEOUS) ×3 IMPLANT
HEMOSTAT SNOW SURGICEL 2X4 (HEMOSTASIS) IMPLANT
KIT BASIN OR (CUSTOM PROCEDURE TRAY) ×3 IMPLANT
NEEDLE INSUFFLATION 14GA 120MM (NEEDLE) ×3 IMPLANT
POUCH SPECIMEN RETRIEVAL 10MM (ENDOMECHANICALS) ×3 IMPLANT
SCISSORS LAP 5X35 DISP (ENDOMECHANICALS) ×3 IMPLANT
SET CHOLANGIOGRAPH MIX (MISCELLANEOUS) IMPLANT
SET IRRIG TUBING LAPAROSCOPIC (IRRIGATION / IRRIGATOR) ×3 IMPLANT
SLEEVE XCEL OPT CAN 5 100 (ENDOMECHANICALS) ×6 IMPLANT
SUT MNCRL AB 4-0 PS2 18 (SUTURE) ×3 IMPLANT
TOWEL OR 17X26 10 PK STRL BLUE (TOWEL DISPOSABLE) ×3 IMPLANT
TOWEL OR NON WOVEN STRL DISP B (DISPOSABLE) ×3 IMPLANT
TRAY LAPAROSCOPIC (CUSTOM PROCEDURE TRAY) ×3 IMPLANT
TROCAR BLADELESS OPT 5 100 (ENDOMECHANICALS) ×3 IMPLANT
TROCAR XCEL 12X100 BLDLESS (ENDOMECHANICALS) ×3 IMPLANT
TUBING INSUF HEATED (TUBING) ×3 IMPLANT

## 2018-02-24 NOTE — Anesthesia Procedure Notes (Signed)
Procedure Name: Intubation Date/Time: 02/24/2018 11:00 AM Performed by: Mitzie Na, CRNA Pre-anesthesia Checklist: Patient identified, Emergency Drugs available, Suction available and Patient being monitored Patient Re-evaluated:Patient Re-evaluated prior to induction Oxygen Delivery Method: Circle system utilized Preoxygenation: Pre-oxygenation with 100% oxygen Induction Type: IV induction Ventilation: Mask ventilation without difficulty Laryngoscope Size: Mac and 3 Grade View: Grade I Tube type: Oral Tube size: 7.0 mm Number of attempts: 1 Airway Equipment and Method: Stylet Placement Confirmation: ETT inserted through vocal cords under direct vision,  positive ETCO2 and breath sounds checked- equal and bilateral Secured at: 20 cm Tube secured with: Tape Dental Injury: Teeth and Oropharynx as per pre-operative assessment

## 2018-02-24 NOTE — Transfer of Care (Signed)
Immediate Anesthesia Transfer of Care Note  Patient: Erica Simmons  Procedure(s) Performed: LAPAROSCOPIC CHOLECYSTECTOMY (N/A Abdomen)  Patient Location: PACU  Anesthesia Type:General  Level of Consciousness: awake, alert , oriented and patient cooperative  Airway & Oxygen Therapy: Patient Spontanous Breathing and Patient connected to face mask oxygen  Post-op Assessment: Report given to RN, Post -op Vital signs reviewed and stable and Patient moving all extremities  Post vital signs: Reviewed and stable  Last Vitals:  Vitals Value Taken Time  BP 136/77 02/24/2018 12:03 PM  Temp    Pulse 86 02/24/2018 12:08 PM  Resp 21 02/24/2018 12:08 PM  SpO2 99 % 02/24/2018 12:08 PM  Vitals shown include unvalidated device data.  Last Pain:  Vitals:   02/24/18 0848  TempSrc:   PainSc: 0-No pain         Complications: No apparent anesthesia complications

## 2018-02-24 NOTE — Op Note (Addendum)
Operative Note  Erica Simmons 47 y.o. female [id number]  02/24/2018  Surgeon: Clovis Riley MD  Assistant: none  Procedure performed: Laparoscopic Cholecystectomy  Preop diagnosis: biliary hyperkinesia Post-op diagnosis/intraop findings: same  Specimens: gallbladder  EBL: minimal  Complications: none  Description of procedure: After obtaining informed consent the patient was brought to the operating room. Prophylactic antibiotics and subcutaneous heparin were administered. SCD's were applied. General endotracheal anesthesia was initiated and a formal time-out was performed. The abdomen was prepped and draped in the usual sterile fashion and the abdomen was entered using visiport technique in the left upper quadrant after instilling the site with local. Insufflation to 72mmHg was obtained and gross inspection revealed no evidence of injury from our entry or other intraabdominal abnormalities. There are adhesions to the abdominal wall from the umbilicus down. Three 64mm trocars were introduced in the supraumbilical, right midclavicular and right anterior axillary lines under direct visualization and following infiltration with local. The entry port was upsized to an 38mm trocar. The gallbladder was retracted cephalad and the infundibulum was retracted laterally. A combination of hook electrocautery and blunt dissection was utilized to clear the peritoneum from the neck and cystic duct, circumferentially isolating the cystic artery and cystic duct and lifting the gallbladder from the cystic plate. The critical view of safety was achieved with the cystic artery, cystic duct, and liver bed visualized between them with no other structures. The artery was clipped with a single clip proximally and distally and divided as was the cystic duct with three clips on the proximal end. The gallbladder was dissected from the liver plate using electrocautery. Once freed the gallbladder was placed in an  endocatch bag and removed through the epigastric trocar site. A small amount of bleeding on the liver bed was controlled with cautery. Some bile had been spilled from the gallbladder during its dissection from the liver bed. This was aspirated and the right upper quadrant was irrigated copiously until the effluent was clear. Hemostasis was once again confirmed, and reinspection of the abdomen revealed no injuries. The clips were opposed without any bile leak from the duct or the liver bed. An additional clip was placed on the cystic duct as the third clip did not look completely opposed, although the duct was not leaking.  The 103mm trocar site in the epigastrium was closed with a 0 vicryl in the fascia under direct visualization using a PMI device. The abdomen was desufflated and all trocars removed. The skin incisions were closed with running subcuticular monocryl and Dermabond. The patient was awakened, extubated and transported to the recovery room in stable condition.   All counts were correct at the completion of the case.

## 2018-02-24 NOTE — Discharge Instructions (Signed)
LAPAROSCOPIC SURGERY: POST OP INSTRUCTIONS  ######################################################################  EAT Gradually transition to a high fiber diet with a fiber supplement over the next few weeks after discharge.  Start with a pureed / full liquid diet (see below)  WALK Walk an hour a day.  Control your pain to do that.    CONTROL PAIN Control pain so that you can walk, sleep, tolerate sneezing/coughing, go up/down stairs.  HAVE A BOWEL MOVEMENT DAILY Keep your bowels regular to avoid problems.  OK to try a laxative to override constipation.  OK to use an antidairrheal to slow down diarrhea.  Call if not better after 2 tries  CALL IF YOU HAVE PROBLEMS/CONCERNS Call if you are still struggling despite following these instructions. Call if you have concerns not answered by these instructions  ######################################################################    1. DIET: Follow a light bland diet the first 24 hours after arrival home, such as soup, liquids, crackers, etc.  Be sure to include lots of fluids daily.  Avoid fast food or heavy meals as your are more likely to get nauseated.  Eat a low fat the next few days after surgery.   2. Take your usually prescribed home medications unless otherwise directed. 3. PAIN CONTROL: a. Pain is best controlled by a usual combination of three different methods TOGETHER: i. Ice/Heat ii. Over the counter pain medication iii. Prescription pain medication b. Most patients will experience some swelling and bruising around the incisions.  Ice packs or heating pads (30-60 minutes up to 6 times a day) will help. Use ice for the first few days to help decrease swelling and bruising, then switch to heat to help relax tight/sore spots and speed recovery.  Some people prefer to use ice alone, heat alone, alternating between ice & heat.  Experiment to what works for you.  Swelling and bruising can take several weeks to resolve.   c. It is  helpful to take an over-the-counter pain medication regularly for the first few weeks.  Choose one of the following that works best for you: i. Naproxen (Aleve, etc)  Two 251m tabs twice a day ii. Ibuprofen (Advil, etc) Three 2053mtabs four times a day (every meal & bedtime) iii. Acetaminophen (Tylenol, etc) 500-65044mour times a day (every meal & bedtime) d. A  prescription for pain medication (such as oxycodone, hydrocodone, etc) should be given to you upon discharge.  Take your pain medication as prescribed.  i. If you are having problems/concerns with the prescription medicine (does not control pain, nausea, vomiting, rash, itching, etc), please call us [phone] see if we need to switch you to a different pain medicine that will work better for you and/or control your side effect better. ii. If you need a refill on your pain medication, please contact your pharmacy.  They will contact our office to request authorization. Prescriptions will not be filled after 5 pm or on week-ends. 4. Avoid getting constipated.  Between the surgery and the pain medications, it is common to experience some constipation.  Increasing fluid intake and taking a fiber supplement (such as Metamucil, Citrucel, FiberCon, MiraLax, etc) 1-2 times a day regularly will usually help prevent this problem from occurring.  A mild laxative (prune juice, Milk of Magnesia, MiraLax, etc) should be taken according to package directions if there are no bowel movements after 48 hours.   5. Watch out for diarrhea.  If you have many loose bowel movements, simplify your diet to bland foods & liquids for  a few days.  Stop any stool softeners and decrease your fiber supplement.  Switching to mild anti-diarrheal medications (Kayopectate, Pepto Bismol) can help.  If this worsens or does not improve, please call us. °6. Wash / shower every day.  You may shower over the skin glue which is waterproof.  Continue to shower over incision(s) after  the dressing is off. °7. Skin glue will flake off after 2-3 weeks.  You may leave the incision open to air.  You may replace a dressing/Band-Aid to cover the incision for comfort if you wish.  °8. ACTIVITIES as tolerated:   °a. You may resume regular (light) daily activities beginning the next day--such as daily self-care, walking, climbing stairs--gradually increasing activities as tolerated.  If you can walk 30 minutes without difficulty, it is safe to try more intense activity such as jogging, treadmill, bicycling, low-impact aerobics, swimming, etc. °b. Save the most intensive and strenuous activity for last such as sit-ups, heavy lifting, contact sports, etc  Refrain from any heavy lifting or straining until you are off narcotics for pain control.   °c. DO NOT PUSH THROUGH PAIN.  Let pain be your guide: If it hurts to do something, don't do it.  Pain is your body warning you to avoid that activity for another week until the pain goes down. °d. You may drive when you are no longer taking prescription pain medication, you can comfortably wear a seatbelt, and you can safely maneuver your car and apply brakes. °e. You may have sexual intercourse when it is comfortable.  °9. FOLLOW UP in our office °a. Please call [facility] at [phone] to set up an appointment to see your surgeon in the office for a follow-up appointment approximately 2-3 weeks after your surgery. °b. Make sure that you call for this appointment the day you arrive home to insure a convenient appointment time. °10. IF YOU HAVE DISABILITY OR FAMILY LEAVE FORMS, BRING THEM TO THE OFFICE FOR PROCESSING.  DO NOT GIVE THEM TO YOUR DOCTOR. ° ° °WHEN TO CALL US [phone]: °1. Poor pain control °2. Reactions / problems with new medications (rash/itching, nausea, etc)  °3. Fever over 101.5 F (38.5 C) °4. Inability to urinate °5. Nausea and/or vomiting °6. Worsening swelling or bruising °7. Continued bleeding from incision. °8. Increased pain, redness, or  drainage from the incision ° ° The clinic staff is available to answer your questions during regular business hours (8:30am-5pm).  Please don’t hesitate to call and ask to speak to one of our nurses for clinical concerns.  ° If you have a medical emergency, go to the nearest emergency room or call 911. ° A surgeon from [facility] is always on call at the hospitals ° ° °[facility] °[address], [address], [place], [place]  [address] ? °MAIN: [phone] ? TOLL FREE: [phone] ?  °FAX [phone] °[email or web address] ° ° °

## 2018-02-24 NOTE — Interval H&P Note (Signed)
History and Physical Interval Note:  02/24/2018 10:13 AM  [name]  has presented today for surgery, with the diagnosis of Biliary dyskinesia  The various methods of treatment have been discussed with the patient and family. After consideration of risks, benefits and other options for treatment, the patient has consented to  Procedure(s): LAPAROSCOPIC CHOLECYSTECTOMY (N/A) as a surgical intervention .  The patient's history has been reviewed, patient examined, no change in status, stable for surgery.  I have reviewed the patient's chart and labs.  Questions were answered to the patient's satisfaction.     Erica Simmons

## 2018-02-24 NOTE — Anesthesia Postprocedure Evaluation (Signed)
Anesthesia Post Note  Patient: Erica Simmons  Procedure(s) Performed: LAPAROSCOPIC CHOLECYSTECTOMY (N/A Abdomen)     Patient location during evaluation: PACU Anesthesia Type: General Level of consciousness: awake and alert Pain management: pain level controlled Vital Signs Assessment: post-procedure vital signs reviewed and stable Respiratory status: spontaneous breathing, nonlabored ventilation, respiratory function stable and patient connected to nasal cannula oxygen Cardiovascular status: blood pressure returned to baseline and stable Postop Assessment: no apparent nausea or vomiting Anesthetic complications: no    Last Vitals:  Vitals:   02/24/18 1345 02/24/18 1424  BP: 123/79 101/84  Pulse: 79 77  Resp: 17 18  Temp: 36.7 C 36.9 C  SpO2: 96% 100%    Last Pain:  Vitals:   02/24/18 1424  TempSrc:   PainSc: 8                  Montez Hageman

## 2018-02-24 NOTE — Anesthesia Preprocedure Evaluation (Addendum)
Anesthesia Evaluation  Patient identified by MRN, date of birth, ID band Patient awake    Reviewed: Allergy & Precautions, NPO status , Patient's Chart, lab work & pertinent test results  Airway Mallampati: II  TM Distance: >3 FB Neck ROM: Full    Dental no notable dental hx.    Pulmonary asthma , Current Smoker,    Pulmonary exam normal breath sounds clear to auscultation       Cardiovascular negative cardio ROS Normal cardiovascular exam Rhythm:Regular Rate:Normal     Neuro/Psych negative neurological ROS  negative psych ROS   GI/Hepatic Neg liver ROS, GERD  Medicated and Controlled,  Endo/Other  negative endocrine ROS  Renal/GU negative Renal ROS  negative genitourinary   Musculoskeletal negative musculoskeletal ROS (+)   Abdominal   Peds negative pediatric ROS (+)  Hematology negative hematology ROS (+)   Anesthesia Other Findings   Reproductive/Obstetrics negative OB ROS                            Anesthesia Physical Anesthesia Plan  ASA: II  Anesthesia Plan: General   Post-op Pain Management:    Induction: Intravenous  PONV Risk Score and Plan: 3 and Ondansetron, Dexamethasone, Midazolam and Treatment may vary due to age or medical condition  Airway Management Planned: Oral ETT  Additional Equipment:   Intra-op Plan:   Post-operative Plan: Extubation in OR  Informed Consent: I have reviewed the patients History and Physical, chart, labs and discussed the procedure including the risks, benefits and alternatives for the proposed anesthesia with the patient or authorized representative who has indicated his/her understanding and acceptance.   Dental advisory given  Plan Discussed with: CRNA  Anesthesia Plan Comments:         Anesthesia Quick Evaluation

## 2018-03-30 DIAGNOSIS — M25572 Pain in left ankle and joints of left foot: Secondary | ICD-10-CM | POA: Diagnosis not present

## 2018-04-07 DIAGNOSIS — M25572 Pain in left ankle and joints of left foot: Secondary | ICD-10-CM | POA: Diagnosis not present

## 2018-04-13 DIAGNOSIS — R197 Diarrhea, unspecified: Secondary | ICD-10-CM | POA: Diagnosis not present

## 2018-04-19 DIAGNOSIS — R197 Diarrhea, unspecified: Secondary | ICD-10-CM | POA: Diagnosis not present

## 2018-04-19 DIAGNOSIS — M25572 Pain in left ankle and joints of left foot: Secondary | ICD-10-CM | POA: Diagnosis not present

## 2018-07-27 ENCOUNTER — Other Ambulatory Visit (HOSPITAL_COMMUNITY): Payer: Self-pay | Admitting: Internal Medicine
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
## 2018-11-26 ENCOUNTER — Emergency Department (HOSPITAL_COMMUNITY)
Admission: EM | Admit: 2018-11-26 | Discharge: 2018-11-26 | Disposition: A | Discharge status: Home / Self Care | Payer: 59 | Attending: Emergency Medicine | Admitting: Emergency Medicine

## 2018-11-26 ENCOUNTER — Other Ambulatory Visit: Payer: Self-pay

## 2018-11-26 ENCOUNTER — Encounter (HOSPITAL_COMMUNITY): Payer: Self-pay | Admitting: Emergency Medicine

## 2018-11-26 ENCOUNTER — Emergency Department (HOSPITAL_COMMUNITY): Payer: 59

## 2018-11-26 DIAGNOSIS — Y998 Other external cause status: Secondary | ICD-10-CM | POA: Insufficient documentation

## 2018-11-26 DIAGNOSIS — W010XXA Fall on same level from slipping, tripping and stumbling without subsequent striking against object, initial encounter: Secondary | ICD-10-CM | POA: Diagnosis not present

## 2018-11-26 DIAGNOSIS — M25461 Effusion, right knee: Secondary | ICD-10-CM | POA: Diagnosis not present

## 2018-11-26 DIAGNOSIS — M25561 Pain in right knee: Secondary | ICD-10-CM | POA: Diagnosis not present

## 2018-11-26 DIAGNOSIS — Y9389 Activity, other specified: Secondary | ICD-10-CM | POA: Insufficient documentation

## 2018-11-26 DIAGNOSIS — F17210 Nicotine dependence, cigarettes, uncomplicated: Secondary | ICD-10-CM | POA: Diagnosis not present

## 2018-11-26 DIAGNOSIS — Y9289 Other specified places as the place of occurrence of the external cause: Secondary | ICD-10-CM | POA: Insufficient documentation

## 2018-11-26 DIAGNOSIS — S8991XA Unspecified injury of right lower leg, initial encounter: Secondary | ICD-10-CM | POA: Diagnosis present

## 2018-11-26 DIAGNOSIS — S83411A Sprain of medial collateral ligament of right knee, initial encounter: Secondary | ICD-10-CM | POA: Diagnosis not present

## 2018-11-26 MED ORDER — HYDROCODONE-ACETAMINOPHEN 5-325 MG PO TABS: 1.0000 | ORAL_TABLET | ORAL | 0 refills | Status: AC | PRN

## 2018-11-26 NOTE — ED Triage Notes (Signed)
PT states she slipped and fell onto her right knee this am on her back deck at home. PT states pain unrelieved by 600mg  of ibuprofen and 650mg  of tylenol. PT ambulatory in triage.

## 2018-11-26 NOTE — Discharge Instructions (Signed)
Your exam today suggest that you have sprained your medial collateral ligament of your knee, however I also am unable to rule out the possibility of a meniscal injury or tear.  Rest, use ice and elevation to help with pain and swelling.  Use your crutches and the knee immobilizer to protect your knee.  Continue using ibuprofen 600 mg 3 times daily.  You may add the pain medication prescribed, however do not drive within 4 hours of taking this medication.  Call the orthopedist for further evaluation of your knee.
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
## 2019-01-07 DIAGNOSIS — J111 Influenza due to unidentified influenza virus with other respiratory manifestations: Secondary | ICD-10-CM | POA: Diagnosis not present

## 2019-01-07 DIAGNOSIS — R05 Cough: Secondary | ICD-10-CM | POA: Diagnosis not present

## 2019-01-07 DIAGNOSIS — R509 Fever, unspecified: Secondary | ICD-10-CM | POA: Diagnosis not present
